# Patient Record
Sex: Male | Born: 1985 | Race: White | Hispanic: No | Marital: Married | Smoking: Never smoker
Health system: Southern US, Community
[De-identification: ages and names within clinical notes are randomized; demographics above are authoritative.]

## PROBLEM LIST (undated history)

## (undated) DIAGNOSIS — F419 Anxiety disorder, unspecified: Secondary | ICD-10-CM

## (undated) HISTORY — PX: WISDOM TOOTH EXTRACTION: SHX21

## (undated) HISTORY — DX: Anxiety disorder, unspecified: F41.9

---

## 2012-08-12 ENCOUNTER — Ambulatory Visit (INDEPENDENT_AMBULATORY_CARE_PROVIDER_SITE_OTHER): Payer: BC Managed Care – PPO | Admitting: Family Medicine

## 2012-08-12 VITALS — BP 112/72 | HR 99 | Temp 98.3°F | Resp 18 | Ht 72.0 in | Wt 210.0 lb

## 2012-08-12 DIAGNOSIS — K529 Noninfective gastroenteritis and colitis, unspecified: Secondary | ICD-10-CM

## 2012-08-12 DIAGNOSIS — R197 Diarrhea, unspecified: Secondary | ICD-10-CM

## 2012-08-12 DIAGNOSIS — R112 Nausea with vomiting, unspecified: Secondary | ICD-10-CM

## 2012-08-12 DIAGNOSIS — R509 Fever, unspecified: Secondary | ICD-10-CM

## 2012-08-12 DIAGNOSIS — R1011 Right upper quadrant pain: Secondary | ICD-10-CM

## 2012-08-12 DIAGNOSIS — F410 Panic disorder [episodic paroxysmal anxiety] without agoraphobia: Secondary | ICD-10-CM

## 2012-08-12 LAB — POCT URINALYSIS DIPSTICK
Protein, UA: 30
Spec Grav, UA: 1.025
Urobilinogen, UA: 0.2
pH, UA: 6

## 2012-08-12 LAB — POCT UA - MICROSCOPIC ONLY
Bacteria, U Microscopic: NEGATIVE
Casts, Ur, LPF, POC: NEGATIVE
Crystals, Ur, HPF, POC: NEGATIVE

## 2012-08-12 LAB — POCT CBC
HCT, POC: 49.6 % (ref 43.5–53.7)
Hemoglobin: 16.2 g/dL (ref 14.1–18.1)
Lymph, poc: 1.2 (ref 0.6–3.4)
MCH, POC: 29.8 pg (ref 27–31.2)
MCHC: 32.7 g/dL (ref 31.8–35.4)
MPV: 9.3 fL (ref 0–99.8)
POC Granulocyte: 7 — AB (ref 2–6.9)
POC LYMPH PERCENT: 13.5 %L (ref 10–50)
POC MID %: 7.5 %M (ref 0–12)
RDW, POC: 12.9 %
WBC: 8.8 10*3/uL (ref 4.6–10.2)

## 2012-08-12 LAB — POCT INFLUENZA A/B: Influenza A, POC: NEGATIVE

## 2012-08-12 LAB — IFOBT (OCCULT BLOOD): IFOBT: NEGATIVE

## 2012-08-12 MED ORDER — LOPERAMIDE HCL 2 MG PO TABS: 2.0000 mg | ORAL_TABLET | Freq: Four times a day (QID) | ORAL | Status: DC | PRN

## 2012-08-12 MED ORDER — ONDANSETRON 4 MG PO TBDP
8.0000 mg | ORAL_TABLET | Freq: Once | ORAL | Status: AC
  Administered 2012-08-12: 8 mg via ORAL

## 2012-08-12 MED ORDER — HYDROCODONE-ACETAMINOPHEN 5-325 MG PO TABS: 1.0000 | ORAL_TABLET | Freq: Four times a day (QID) | ORAL | Status: DC | PRN

## 2012-08-12 MED ORDER — PROMETHAZINE HCL 25 MG PO TABS: 25.0000 mg | ORAL_TABLET | Freq: Three times a day (TID) | ORAL | Status: DC | PRN

## 2012-08-12 MED ORDER — PROPRANOLOL HCL 40 MG PO TABS: 40.0000 mg | ORAL_TABLET | Freq: Four times a day (QID) | ORAL | Status: DC | PRN

## 2012-08-12 NOTE — Progress Notes (Addendum)
Subjective:    Patient ID: Nathan [name], male    DOB: Mar 19, 1986, 27 y.o.   MRN: [id number] Chief Complaint  Patient presents with  . Generalized Body Aches    today  . Fever    today  . Diarrhea    yesterday  . Nausea    yesterday   HPI  Sore throat for 2 wks and within the last 24 hrs he started getting much worse.  Vomiting but has been able to get 1 sip of water down and a little sprite. Temp was >100 this a.m. No recent antibiotics.  No recent sick contact, no travels, no recent alcohol. Has not been trying any otc medicines other than some chlorasceptic sprray.  Took a tylenol cold and flu.  Having diffuse abd pains - started epigastric last night (thought heartburn as hotdogs for lunch).  Then started throwing up.  Diarrhea large volume liquid - large.  Did get flu shot this yrs.  For several months, he has had some RUQ/Rt flank pains occ - has been wondering if he could have gallstones so recently has been trying to do a low fat diet.  Also for the past several months, when he is called on in work or school situations, he will get very nervous.  Happened to him first when he was called on his class - he developed sweating, palpitations, shortness of breath, thought he was going to pass out.  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  No prior h/o any mood d/o. No depression.  Didn't know what was going on when it first started but now realized it must be anxiety/nerves since it is so situationally reproducible.  Past Medical History  Diagnosis Date  . Anxiety    No current outpatient prescriptions on file prior to visit.   No current facility-administered medications on file prior to visit.   No Known Allergies   Review of Systems  Constitutional: Positive for fever, chills, diaphoresis, activity change, appetite change and fatigue. Negative for unexpected  weight change.  HENT: Positive for congestion, sore throat and rhinorrhea.   Respiratory: Negative for cough and shortness of breath.   Cardiovascular: Negative for chest pain.  Gastrointestinal: Positive for nausea, vomiting, abdominal pain, diarrhea and abdominal distention. Negative for constipation, blood in stool, anal bleeding and rectal pain.  Genitourinary: Negative for dysuria, frequency and decreased urine volume.  Skin: Negative for rash.  Hematological: Negative for adenopathy.  Psychiatric/Behavioral: Negative for dysphoric mood. The patient is nervous/anxious.       BP 112/72  Pulse 111  Temp(Src) 99.8 F (37.7 C) (Oral)  Resp 18  Ht 6' (1.829 m)  Wt 210 lb (95.255 kg)  BMI 28.47 kg/m2  SpO2 97% Objective:   Physical Exam  Constitutional: He appears well-developed and well-nourished. No distress.  HENT:  Head: Normocephalic and atraumatic.  Neck: Normal range of motion. Neck supple. No thyromegaly present.  Cardiovascular: Normal rate, regular rhythm and normal heart sounds.   Pulmonary/Chest: Effort normal and breath sounds normal.  Abdominal: Soft. Normal appearance. He exhibits no distension and no mass. Bowel sounds are increased. There is no hepatosplenomegaly. There is generalized tenderness. There is guarding. There is no rigidity, no rebound, no CVA tenderness, no tenderness at McBurney's point and negative Murphy's sign. No hernia.  Genitourinary: Rectum normal and prostate normal. Rectal exam shows no tenderness and anal tone  normal. Guaiac negative stool.  Lymphadenopathy:    He has no cervical adenopathy.  Skin: He is not diaphoretic.      Results for orders placed in visit on 08/12/12  COMPREHENSIVE METABOLIC PANEL      Result Value Range   Sodium 136  135 - 145 mEq/L   Potassium 4.2  3.5 - 5.3 mEq/L   Chloride 106  96 - 112 mEq/L   CO2 23  19 - 32 mEq/L   Glucose, Bld 99  70 - 99 mg/dL   BUN 13  6 - 23 mg/dL   Creat 5.40  9.81 - 1.91 mg/dL    Total Bilirubin 1.9 (*) 0.3 - 1.2 mg/dL   Alkaline Phosphatase 55  39 - 117 U/L   AST 20  0 - 37 U/L   ALT 27  0 - 53 U/L   Total Protein 6.7  6.0 - 8.3 g/dL   Albumin 4.6  3.5 - 5.2 g/dL   Calcium 9.1  8.4 - 47.8 mg/dL  FECAL LACTOFERRIN      Result Value Range   LACTOFERRIN POSITIVE    LIPASE      Result Value Range   Lipase 12  0 - 75 U/L  POCT CBC      Result Value Range   WBC 8.8  4.6 - 10.2 K/uL   Lymph, poc 1.2  0.6 - 3.4   POC LYMPH PERCENT 13.5  10 - 50 %L   MID (cbc) 0.7  0 - 0.9   POC MID % 7.5  0 - 12 %M   POC Granulocyte 7.0 (*) 2 - 6.9   Granulocyte percent 79.0  37 - 80 %G   RBC 5.43  4.69 - 6.13 M/uL   Hemoglobin 16.2  14.1 - 18.1 g/dL   HCT, POC 29.5  62.1 - 53.7 %   MCV 91.4  80 - 97 fL   MCH, POC 29.8  27 - 31.2 pg   MCHC 32.7  31.8 - 35.4 g/dL   RDW, POC 30.8     Platelet Count, POC 250  142 - 424 K/uL   MPV 9.3  0 - 99.8 fL  POCT INFLUENZA A/B      Result Value Range   Influenza A, POC Negative     Influenza B, POC Negative    POCT UA - MICROSCOPIC ONLY      Result Value Range   WBC, Ur, HPF, POC 0-1     RBC, urine, microscopic 0-1     Bacteria, U Microscopic neg     Mucus, UA trace     Epithelial cells, urine per micros 0-1     Crystals, Ur, HPF, POC neg     Casts, Ur, LPF, POC neg     Yeast, UA neg    POCT URINALYSIS DIPSTICK      Result Value Range   Color, UA amber     Clarity, UA clear     Glucose, UA neg     Bilirubin, UA small     Ketones, UA trace     Spec Grav, UA 1.025     Blood, UA neg     pH, UA 6.0     Protein, UA 30     Urobilinogen, UA 0.2     Nitrite, UA neg     Leukocytes, UA Negative    IFOBT (OCCULT BLOOD)      Result Value Range   IFOBT Negative  Assessment & Plan:  Diarrhea - Plan: POCT CBC, POCT UA - Microscopic Only, POCT urinalysis dipstick, Comprehensive metabolic panel, Fecal Lactoferrin, IFOBT POC (occult bld, rslt in office), ondansetron (ZOFRAN-ODT) disintegrating tablet 8 mg, Lipase  Fever -  Plan: POCT CBC, POCT Influenza A/B, POCT UA - Microscopic Only, POCT urinalysis dipstick, Comprehensive metabolic panel, Fecal Lactoferrin, IFOBT POC (occult bld, rslt in office), ondansetron (ZOFRAN-ODT) disintegrating tablet 8 mg, Lipase  Nausea with vomiting - Plan: POCT CBC, POCT UA - Microscopic Only, POCT urinalysis dipstick, Comprehensive metabolic panel, Fecal Lactoferrin, IFOBT POC (occult bld, rslt in office), ondansetron (ZOFRAN-ODT) disintegrating tablet 8 mg, Lipase  Gastroenteritis, acute - suspect viral. Treat symptomatically and push fluids  Panic attacks - Plan: propranolol (INDERAL) 40 MG tablet.  Sounds like pt is developing panic attacks in social anxiety producing situations.  Try prn propranolol and recommend therapy - given therapist referral for CBT with several names to contact.  Colicky RUQ abdominal pain - Plan: US Abdomen Limited RUQ - I do not think he is having acute cholecystitis at this time due to lack of peritoneal signs, acute abdomen, or murphy's sign  Meds ordered this encounter  Medications  . ondansetron (ZOFRAN-ODT) disintegrating tablet 8 mg    Sig:   . propranolol (INDERAL) 40 MG tablet    Sig: Take 1 tablet (40 mg total) by mouth 4 (four) times daily as needed (anxiety).    Dispense:  60 tablet    Refill:  2  . promethazine (PHENERGAN) 25 MG tablet    Sig: Take 1 tablet (25 mg total) by mouth every 8 (eight) hours as needed for nausea.    Dispense:  20 tablet    Refill:  0  . loperamide (IMODIUM A-D) 2 MG tablet    Sig: Take 1 tablet (2 mg total) by mouth 4 (four) times daily as needed for diarrhea or loose stools.    Dispense:  30 tablet    Refill:  0  . HYDROcodone-acetaminophen (NORCO/VICODIN) 5-325 MG per tablet    Sig: Take 1 tablet by mouth every 6 (six) hours as needed for pain.    Dispense:  10 tablet    Refill:  0

## 2012-08-12 NOTE — Patient Instructions (Addendum)
I STRONGLY recommend counseling or cognitive-behavioral therapy to learn about coping mechanisms to prevent/stop panic attacks.  Contact your HR department and ask about the EAP (employee assistance program).  At no cost to you and at complete confidentiality, they should arrange for you to see a counselor for several visits.   If this doesn't work, you may want to consider:  Garey Ham, at [facility] [address] [phone] or Wynelle Fanny, at [facility] [address] [phone] ? [phone] or Normajean Glasgow, at [facility] [address] [phone]  Jobe Gibbon at the [facility] [address] [phone] or   Viral Gastroenteritis Viral gastroenteritis is also known as stomach flu. This condition affects the stomach and intestinal tract. It can cause sudden diarrhea and vomiting. The illness typically lasts 3 to 8 days. Most people develop an immune response that eventually gets rid of the virus. While this natural response develops, the virus can make you quite ill. CAUSES  Many different viruses can cause gastroenteritis, such as rotavirus or noroviruses. You can catch one of these viruses by consuming contaminated food or water. You may also catch a virus by sharing utensils or other personal items with an infected person or by touching a contaminated surface. SYMPTOMS  The most common symptoms are diarrhea and vomiting. These problems can cause a severe loss of body fluids (dehydration) and a body salt (electrolyte) imbalance. Other symptoms may include:  Fever.  Headache.  Fatigue.  Abdominal pain. DIAGNOSIS  Your caregiver can usually diagnose viral gastroenteritis based on your symptoms and a physical exam. A stool sample may also be taken to test for the presence of viruses or other infections. TREATMENT  This illness typically goes away on  its own. Treatments are aimed at rehydration. The most serious cases of viral gastroenteritis involve vomiting so severely that you are not able to keep fluids down. In these cases, fluids must be given through an intravenous line (IV). HOME CARE INSTRUCTIONS   Drink enough fluids to keep your urine clear or pale yellow. Drink small amounts of fluids frequently and increase the amounts as tolerated.  Ask your caregiver for specific rehydration instructions.  Avoid:  Foods high in sugar.  Alcohol.  Carbonated drinks.  Tobacco.  Juice.  Caffeine drinks.  Extremely hot or cold fluids.  Fatty, greasy foods.  Too much intake of anything at one time.  Dairy products until 24 to 48 hours after diarrhea stops.  You may consume probiotics. Probiotics are active cultures of beneficial bacteria. They may lessen the amount and number of diarrheal stools in adults. Probiotics can be found in yogurt with active cultures and in supplements.  Wash your hands well to avoid spreading the virus.  Only take over-the-counter or prescription medicines for pain, discomfort, or fever as directed by your caregiver. Do not give aspirin to children. Antidiarrheal medicines are not recommended.  Ask your caregiver if you should continue to take your regular prescribed and over-the-counter medicines.  Keep all follow-up appointments as directed by your caregiver. SEEK IMMEDIATE MEDICAL CARE IF:   You are unable to keep fluids down.  You do not urinate at least once every 6 to 8 hours.  You develop shortness of breath.  You notice blood in your stool or vomit. This may look like coffee grounds.  You have abdominal pain that increases or is concentrated in one small area (localized).  You  have persistent vomiting or diarrhea.  You have a fever.  The patient is a child younger than 3 months, and he or she has a fever.  The patient is a child older than 3 months, and he or she has a fever and  persistent symptoms.  The patient is a child older than 3 months, and he or she has a fever and symptoms suddenly get worse.  The patient is a baby, and he or she has no tears when crying. MAKE SURE YOU:   Understand these instructions.  Will watch your condition.  Will get help right away if you are not doing well or get worse. Document Released: 05/15/2005 Document Revised: 08/07/2011 Document Reviewed: 03/01/2011 [facility] Patient Information 2013 [place], [place]. Cholecystitis Cholecystitis is an inflammation of your gallbladder. It is usually caused by a buildup of gallstones or sludge (cholelithiasis) in your gallbladder. The gallbladder stores a fluid that helps digest fats (bile). Cholecystitis is serious and needs treatment right away.  CAUSES   Gallstones. Gallstones can block the tube that leads to your gallbladder, causing bile to build up. As bile builds up, the gallbladder becomes inflamed.  Bile duct problems, such as blockage from scarring or kinking.  Tumors. Tumors can stop bile from leaving your gallbladder correctly, causing bile to build up. As bile builds up, the gallbladder becomes inflamed. SYMPTOMS   Nausea.  Vomiting.  Abdominal pain, especially in the upper right area of your abdomen.  Abdominal tenderness or bloating.  Sweating.  Chills.  Fever.  Yellowing of the skin and the whites of the eyes (jaundice). DIAGNOSIS  Your caregiver may order blood tests to look for infection or gallbladder problems. Your caregiver may also order imaging tests, such as an ultrasound or computed tomography (CT) scan. Further tests may include a hepatobiliary iminodiacetic acid (HIDA) scan. This scan allows your caregiver to see your bile move from the liver to the gallbladder and to the small intestine. TREATMENT  A hospital stay is usually necessary to lessen the inflammation of your gallbladder. You may be required to not eat or drink (fast) for a certain amount  of time. You may be given medicine to treat pain or an antibiotic medicine to treat an infection. Surgery may be needed to remove your gallbladder (cholecystectomy) once the inflammation has gone down. Surgery may be needed right away if you develop complications such as death of gallbladder tissue (gangrene) or a tear (perforation) of the gallbladder.  HOME CARE INSTRUCTIONS  Home care will depend on your treatment. In general:  If you were given antibiotics, take them as directed. Finish them even if you start to feel better.  Only take over-the-counter or prescription medicines for pain, discomfort, or fever as directed by your caregiver.  Follow a low-fat diet until you see your caregiver again.  Keep all follow-up visits as directed by your caregiver. SEEK IMMEDIATE MEDICAL CARE IF:   Your pain is increasing and not controlled by medicines.  Your pain moves to another part of your abdomen or to your back.  You have a fever.  You have nausea and vomiting. MAKE SURE YOU:  Understand these instructions.  Will watch your condition.  Will get help right away if you are not doing well or get worse. Document Released: 05/15/2005 Document Revised: 08/07/2011 Document Reviewed: 03/31/2011 [facility] Patient Information 2013 [place], [place]. Cholelithiasis Cholelithiasis (also called gallstones) is a form of gallbladder disease where gallstones form in your gallbladder. The gallbladder is a non-essential organ that  stores bile made in the liver, which helps digest fats. Gallstones begin as small crystals and slowly grow into stones. Gallstone pain occurs when the gallbladder spasms, and a gallstone is blocking the duct. Pain can also occur when a stone passes out of the duct.  Women are more likely to develop gallstones than men. Other factors that increase the risk of gallbladder disease are:  Having multiple pregnancies. Physicians sometimes advise removing diseased gallbladders before  future pregnancies.  Obesity.  Diets heavy in fried foods and fat.  Increasing age (older than 9).  Prolonged use of medications containing male hormones.  Diabetes mellitus.  Rapid weight loss.  Family history of gallstones (heredity). SYMPTOMS  Feeling sick to your stomach (nauseous).  Abdominal pain.  Yellowing of the skin (jaundice).  Sudden pain. It may persist from several minutes to several hours.  Worsening pain with deep breathing or when jarred.  Fever.  Tenderness to the touch. In some cases, when gallstones do not move into the bile duct, people have no pain or symptoms. These are called "silent" gallstones. TREATMENT In severe cases, emergency surgery may be required. HOME CARE INSTRUCTIONS   Only take over-the-counter or prescription medicines for pain, discomfort, or fever as directed by your caregiver.  Follow a low-fat diet until seen again. Fat causes the gallbladder to contract, which can result in pain.  Follow up as instructed. Attacks are almost always recurrent and surgery is usually required for permanent treatment. SEEK IMMEDIATE MEDICAL CARE IF:   Your pain increases and is not controlled by medications.  You have an oral temperature above 102 F (38.9 C), not controlled by medication.  You develop nausea and vomiting. MAKE SURE YOU:   Understand these instructions.  Will watch your condition.  Will get help right away if you are not doing well or get worse. Document Released: 05/11/2005 Document Revised: 08/07/2011 Document Reviewed: 07/14/2010 [facility] Patient Information 2013 [place], [place].

## 2012-08-13 LAB — COMPREHENSIVE METABOLIC PANEL
Alkaline Phosphatase: 55 U/L (ref 39–117)
BUN: 13 mg/dL (ref 6–23)
CO2: 23 mEq/L (ref 19–32)
Creat: 0.81 mg/dL (ref 0.50–1.35)
Glucose, Bld: 99 mg/dL (ref 70–99)
Total Bilirubin: 1.9 mg/dL — ABNORMAL HIGH (ref 0.3–1.2)
Total Protein: 6.7 g/dL (ref 6.0–8.3)

## 2012-08-13 LAB — LIPASE: Lipase: 12 U/L (ref 0–75)

## 2012-08-13 LAB — FECAL LACTOFERRIN, QUANT: Lactoferrin: POSITIVE

## 2012-08-23 ENCOUNTER — Telehealth: Payer: Self-pay

## 2012-08-23 NOTE — Telephone Encounter (Signed)
Thanks

## 2012-08-23 NOTE — Telephone Encounter (Signed)
Ok. I will send them the other codes and tell them to disregard the ones inconsistent with age.

## 2012-08-23 NOTE — Telephone Encounter (Signed)
Dr. Clelia Croft, Nathan [name] is sending [place] a message saying that the diagnosis on his last OV is inconsistent with his age. Can you please change his dx so that I can resubmit to [facility]. Thanks (The first diagnosis is newborn fever)

## 2012-08-23 NOTE — Telephone Encounter (Signed)
Yes, my assistant accidentally put this in but had already associated the flu test so I couldn't remove it.  There are plenty of other diagnosis in the note to associate the tests with. To get it removed, I guess you would need to cancel tests that have already been resulted and then cancel the diagnosis and re-add the test??? Or something?

## 2012-09-20 ENCOUNTER — Encounter: Payer: Self-pay | Admitting: Family Medicine

## 2012-09-20 ENCOUNTER — Ambulatory Visit: Payer: BC Managed Care – PPO | Admitting: Family Medicine

## 2012-09-20 VITALS — BP 120/64 | HR 63 | Temp 99.1°F | Resp 16 | Ht 72.0 in | Wt 213.0 lb

## 2012-09-20 DIAGNOSIS — F411 Generalized anxiety disorder: Secondary | ICD-10-CM

## 2012-09-20 DIAGNOSIS — F909 Attention-deficit hyperactivity disorder, unspecified type: Secondary | ICD-10-CM

## 2012-09-20 DIAGNOSIS — K2990 Gastroduodenitis, unspecified, without bleeding: Secondary | ICD-10-CM

## 2012-09-20 DIAGNOSIS — K2970 Gastritis, unspecified, without bleeding: Secondary | ICD-10-CM

## 2012-09-20 DIAGNOSIS — K219 Gastro-esophageal reflux disease without esophagitis: Secondary | ICD-10-CM

## 2012-09-20 MED ORDER — RANITIDINE HCL 150 MG PO TABS: 150.0000 mg | ORAL_TABLET | Freq: Two times a day (BID) | ORAL | Status: DC | PRN

## 2012-09-20 MED ORDER — ESOMEPRAZOLE MAGNESIUM 40 MG PO CPDR: 40.0000 mg | DELAYED_RELEASE_CAPSULE | Freq: Every day | ORAL | Status: DC

## 2012-09-20 MED ORDER — BUPROPION HCL ER (XL) 150 MG PO TB24: 150.0000 mg | ORAL_TABLET | Freq: Every day | ORAL | Status: DC

## 2012-09-20 MED ORDER — AMPHETAMINE-DEXTROAMPHETAMINE 10 MG PO TABS: 10.0000 mg | ORAL_TABLET | Freq: Two times a day (BID) | ORAL | Status: DC | PRN

## 2012-09-20 NOTE — Progress Notes (Signed)
  Subjective:    Patient ID: Nathan [name], male    DOB: 09-May-1986, 27 y.o.   MRN: [id number] Chief Complaint  Patient presents with  . ADHD    UNABLE TO FOCUS   HPI  Is very busy at work, working as an Comptroller.  Still having occ RUQ pains after eating and a lot of gas, back and forth between constipation and diarrhea.  No changes in diet. Does notice more sxs after spicey or fried foods.  He has been using nexium daily - takes it after dinner - does help.  Has always been easily distracted - by noises - hard to focus. If someone is tapping or clicking - every little sound distracts him.  Has been that way for a long time - now he works in an office with tiny cubicles so surrounded by people typing and on foods - can't focus on work.  Has tried to help focus - a friend has ADHD - so he is self-medicating w/ caffeine - maybe helps him focus a little better.  Putting ear buds in - has music in his ear which has helped.  He was evaluated in the 5th grade.  Did get into trouble - labeled "behaviourally-emotionally" handicapped-disabled, and very impulsive.  Has had gotten reminded not to jump around on cases as has several incomplete. Anxiety is fed by all the commotion and he can't focus.  Struggled through school. Papers and homework and As but always gets Bc and Cs on exam as couldn't get solid thought and always trouble finishing on time.  Maternal Grandmother had some psych problems  Propranolol has helped physical symptoms of anxiety - taking bid.  Past Medical History  Diagnosis Date  . Anxiety    No current outpatient prescriptions on file prior to visit.   No current facility-administered medications on file prior to visit.   No Known Allergies   Review of Systems    BP 120/64  Pulse 63  Temp(Src) 99.1 F (37.3 C) (Oral)  Resp 16  Ht 6' (1.829 m)  Wt 213 lb (96.616 kg)  BMI 28.88 kg/m2  SpO2 99% Objective:   Physical Exam        Assessment & Plan:   Unspecified gastritis and gastroduodenitis without mention of hemorrhage  GERD (gastroesophageal reflux disease)  ADHD (attention deficit hyperactivity disorder)  Anxiety state, unspecified  Meds ordered this encounter  Medications  . FIBER PO    Sig: Take by mouth daily.  Marland Kitchen DISCONTD: esomeprazole (NEXIUM) 40 MG capsule    Sig: Take 1 capsule (40 mg total) by mouth daily.    Dispense:  30 capsule    Refill:  3  . DISCONTD: ranitidine (ZANTAC) 150 MG tablet    Sig: Take 1 tablet (150 mg total) by mouth 2 (two) times daily as needed for heartburn.    Dispense:  90 tablet    Refill:  0  . DISCONTD: buPROPion (WELLBUTRIN XL) 150 MG 24 hr tablet    Sig: Take 1 tablet (150 mg total) by mouth daily.    Dispense:  30 tablet    Refill:  1  . DISCONTD: amphetamine-dextroamphetamine (ADDERALL) 10 MG tablet    Sig: Take 1 tablet (10 mg total) by mouth 2 (two) times daily as needed.    Dispense:  30 tablet    Refill:  0

## 2012-09-25 ENCOUNTER — Telehealth: Payer: Self-pay

## 2012-09-25 ENCOUNTER — Other Ambulatory Visit: Payer: Self-pay | Admitting: Family Medicine

## 2012-09-25 MED ORDER — METHYLPHENIDATE HCL 5 MG PO TABS: 5.0000 mg | ORAL_TABLET | Freq: Every day | ORAL | Status: DC

## 2012-09-25 NOTE — Telephone Encounter (Signed)
Medication is making patient dizzy and he is sweating profusely    [phone]

## 2012-09-25 NOTE — Telephone Encounter (Signed)
No, he does not want to try 1/2 tab, he states the Rx was not effective for him either. I can advise him to wait a couple weeks before trying the Ritalin, if you would like, but he does not want the Adderall again.

## 2012-09-25 NOTE — Telephone Encounter (Signed)
Did he try 1/2 tab instead of a whole tab? Or even 1/4 of a tab? If still having sxs with 1/2 tab, then I am happy to try switching him to ritalin instead of adderrall.  Alternatively, he may have less side effects on a sustained release tablet but I would like to get the wellbutrin into his system before we try that.

## 2012-09-25 NOTE — Telephone Encounter (Signed)
Called patient , he states he thinks the Adderall has caused the sweating, advised him it may be the Wellbutrin, but he is sure it is the Adderall, advised him to d/c this medication. He agrees, do you want to prescribe an alternative? He agrees to come in if the dizziness continues after d/c the Adderall.

## 2012-09-25 NOTE — Telephone Encounter (Signed)
I printed out a ritalin rx for him to pick up to try.  Advised to bring in adderrall bottle so we can safely dispose of it for him when he picks up new rx.  Make sure he feels back to baseline and sx free before trying the ritalin.

## 2012-09-26 NOTE — Telephone Encounter (Signed)
Pt notified that rx is ready for pick up

## 2012-09-30 ENCOUNTER — Telehealth: Payer: Self-pay | Admitting: Physician Assistant

## 2012-09-30 MED ORDER — METHYLPHENIDATE HCL 5 MG PO TABS: 5.0000 mg | ORAL_TABLET | Freq: Every day | ORAL | Status: DC

## 2012-09-30 NOTE — Telephone Encounter (Signed)
Patient returned with Adderall bottle.  Unable to locate Ritalin prescription. I have reprinted this after verifying he has not already received this rx.

## 2012-10-18 ENCOUNTER — Encounter: Payer: Self-pay | Admitting: Family Medicine

## 2012-10-18 ENCOUNTER — Ambulatory Visit (INDEPENDENT_AMBULATORY_CARE_PROVIDER_SITE_OTHER): Payer: BC Managed Care – PPO | Admitting: Family Medicine

## 2012-10-18 VITALS — BP 140/74 | HR 80 | Temp 98.2°F | Resp 16 | Ht 73.5 in | Wt 215.4 lb

## 2012-10-18 DIAGNOSIS — K2970 Gastritis, unspecified, without bleeding: Secondary | ICD-10-CM

## 2012-10-18 DIAGNOSIS — F410 Panic disorder [episodic paroxysmal anxiety] without agoraphobia: Secondary | ICD-10-CM

## 2012-10-18 DIAGNOSIS — B009 Herpesviral infection, unspecified: Secondary | ICD-10-CM

## 2012-10-18 DIAGNOSIS — F909 Attention-deficit hyperactivity disorder, unspecified type: Secondary | ICD-10-CM

## 2012-10-18 MED ORDER — BUPROPION HCL ER (XL) 150 MG PO TB24: 150.0000 mg | ORAL_TABLET | Freq: Every day | ORAL | Status: DC

## 2012-10-18 MED ORDER — METHYLPHENIDATE HCL 5 MG PO TABS: 5.0000 mg | ORAL_TABLET | Freq: Two times a day (BID) | ORAL | Status: DC

## 2012-10-18 MED ORDER — PROPRANOLOL HCL 40 MG PO TABS: 40.0000 mg | ORAL_TABLET | Freq: Four times a day (QID) | ORAL | Status: DC | PRN

## 2012-10-18 MED ORDER — VALACYCLOVIR HCL 1 G PO TABS: 1000.0000 mg | ORAL_TABLET | Freq: Every day | ORAL | Status: DC

## 2012-10-18 MED ORDER — RANITIDINE HCL 150 MG PO TABS: 150.0000 mg | ORAL_TABLET | Freq: Two times a day (BID) | ORAL | Status: DC | PRN

## 2012-10-18 MED ORDER — ESOMEPRAZOLE MAGNESIUM 40 MG PO CPDR: 40.0000 mg | DELAYED_RELEASE_CAPSULE | Freq: Every day | ORAL | Status: DC

## 2012-10-18 NOTE — Patient Instructions (Addendum)
Start taking the valtrex once per day now and continue this through your honeymoon. Then you can stop but restart if you are ever having a stressful time or ill (like going through training.) At first sign of an outbreak, restart valtrex twice a day for 3 days. Attention Deficit Hyperactivity Disorder Attention deficit hyperactivity disorder (ADHD) is a problem with behavior issues based on the way the brain functions (neurobehavioral disorder). It is a common reason for behavior and academic problems in school. CAUSES  The cause of ADHD is unknown in most cases. It may run in families. It sometimes can be associated with learning disabilities and other behavioral problems. SYMPTOMS  There are 3 types of ADHD. The 3 types and some of the symptoms include:  Inattentive  Gets bored or distracted easily.  Loses or forgets things. Forgets to hand in homework.  Has trouble organizing or completing tasks.  Difficulty staying on task.  An inability to organize daily tasks and school work.  Leaving projects, chores, or homework unfinished.  Trouble paying attention or responding to details. Careless mistakes.  Difficulty following directions. Often seems like is not listening.  Dislikes activities that require sustained attention (like chores or homework).  Hyperactive-impulsive  Feels like it is impossible to sit still or stay in a seat. Fidgeting with hands and feet.  Trouble waiting turn.  Talking too much or out of turn. Interruptive.  Speaks or acts impulsively.  Aggressive, disruptive behavior.  Constantly busy or on the go, noisy.  Combined  Has symptoms of both of the above. Often children with ADHD feel discouraged about themselves and with school. They often perform well below their abilities in school. These symptoms can cause problems in home, school, and in relationships with peers. As children get older, the excess motor activities can calm down, but the problems  with paying attention and staying organized persist. Most children do not outgrow ADHD but with good treatment can learn to cope with the symptoms. DIAGNOSIS  When ADHD is suspected, the diagnosis should be made by professionals trained in ADHD.  Diagnosis will include:  Ruling out other reasons for the child's behavior.  The caregivers will check with the child's school and check their medical records.  They will talk to teachers and parents.  Behavior rating scales for the child will be filled out by those dealing with the child on a daily basis. A diagnosis is made only after all information has been considered. TREATMENT  Treatment usually includes behavioral treatment often along with medicines. It may include stimulant medicines. The stimulant medicines decrease impulsivity and hyperactivity and increase attention. Other medicines used include antidepressants and certain blood pressure medicines. Most experts agree that treatment for ADHD should address all aspects of the child's functioning. Treatment should not be limited to the use of medicines alone. Treatment should include structured classroom management. The parents must receive education to address rewarding good behavior, discipline, and limit-setting. Tutoring or behavioral therapy or both should be available for the child. If untreated, the disorder can have long-term serious effects into adolescence and adulthood. HOME CARE INSTRUCTIONS   Often with ADHD there is a lot of frustration among the family in dealing with the illness. There is often blame and anger that is not warranted. This is a life long illness. There is no way to prevent ADHD. In many cases, because the problem affects the family as a whole, the entire family may need help. A therapist can help the family find better  ways to handle the disruptive behaviors and promote change. If the child is young, most of the therapist's work is with the parents. Parents will  learn techniques for coping with and improving their child's behavior. Sometimes only the child with the ADHD needs counseling. Your caregivers can help you make these decisions.  Children with ADHD may need help in organizing. Some helpful tips include:  Keep routines the same every day from wake-up time to bedtime. Schedule everything. This includes homework and playtime. This should include outdoor and indoor recreation. Keep the schedule on the refrigerator or a bulletin board where it is frequently seen. Mark schedule changes as far in advance as possible.  Have a place for everything and keep everything in its place. This includes clothing, backpacks, and school supplies.  Encourage writing down assignments and bringing home needed books.  Offer your child a well-balanced diet. Breakfast is especially important for school performance. Children should avoid drinks with caffeine including:  Soft drinks.  Coffee.  Tea.  However, some older children (adolescents) may find these drinks helpful in improving their attention.  Children with ADHD need consistent rules that they can understand and follow. If rules are followed, give small rewards. Children with ADHD often receive, and expect, criticism. Look for good behavior and praise it. Set realistic goals. Give clear instructions. Look for activities that can foster success and self-esteem. Make time for pleasant activities with your child. Give lots of affection.  Parents are their children's greatest advocates. Learn as much as possible about ADHD. This helps you become a stronger and better advocate for your child. It also helps you educate your child's teachers and instructors if they feel inadequate in these areas. Parent support groups are often helpful. A national group with local chapters is called CHADD (Children and Adults with Attention Deficit Hyperactivity Disorder). PROGNOSIS  There is no cure for ADHD. Children with the  disorder seldom outgrow it. Many find adaptive ways to accommodate the ADHD as they mature. SEEK MEDICAL CARE IF:  Your child has repeated muscle twitches, cough or speech outbursts.  Your child has sleep problems.  Your child has a marked loss of appetite.  Your child develops depression.  Your child has new or worsening behavioral problems.  Your child develops dizziness.  Your child has a racing heart.  Your child has stomach pains.  Your child develops headaches. Document Released: 05/05/2002 Document Revised: 08/07/2011 Document Reviewed: 12/16/2007 [facility] Patient Information 2014 [place], [place].  Methylphenidate tablets What is this medicine? METHYLPHENIDATE (meth il FEN i date) is used to treat attention-deficit hyperactivity disorder (ADHD). It is also used to treat narcolepsy. This medicine may be used for other purposes; ask your health care provider or pharmacist if you have questions. What should I tell my health care provider before I take this medicine? They need to know if you have any of these conditions: -difficulty swallowing, problems with the esophagus, or a history of blockage of the stomach or intestines -family history of suicide -glaucoma -heart condition or recent history of a heart attack -high blood pressure -history of a drug or alcohol abuse problem -liver disease -mental illness, including anxiety, bipolar disorder, depression, mania or schizophrenia -motor tics, family history or diagnosis of Tourette's syndrome -overactive thyroid -seizures -taken an MAOI like Carbex, Eldepryl, Marplan, Nardil, or Parnate in last 14 days -an unusual or allergic reaction to methylphenidate, other medicines, foods, dyes, or preservatives -pregnant or trying to get pregnant -breast-feeding How should I use this  medicine? Take this medicine by mouth with a glass of water. Follow the directions on the prescription label. It is best to take this medicine 30 to  45 minutes before meals, unless your doctor tells you otherwise. Take your medicine at regular intervals. Usually the last dose of the day will be taken at least 4 to 6 hours before bedtime, so it will not interfere with sleep. Do not take your medicine more often than directed. A special MedGuide will be given to you by the pharmacist with each prescription and refill. Be sure to read this information carefully each time. Talk to your pediatrician regarding the use of this medicine in children. While this drug may be prescribed for children as young as 73 years of age for selected conditions, precautions do apply. Overdosage: If you think you have taken too much of this medicine contact a poison control center or emergency room at once. NOTE: This medicine is only for you. Do not share this medicine with others. What if I miss a dose? If you miss a dose, take it as soon as you can. If it is almost time for your next dose, take only that dose. Do not take double or extra doses. What may interact with this medicine? Do not take this medicine with any of the following medications: -atomoxetine -lithium -medicines called MAO Inhibitors like Nardil, Parnate, Marplan, Eldepryl -other stimulant medicines like amphetamine, dextroamphetamine, dexmethylphenidate, modafinil -procarbazine This medicine may also interact with the following medications: -medicines for blood pressure -caffeine -medicines to decrease appetite or cause weight loss -medicines for depression, anxiety, or psychotic disturbances -medicines for seizures -warfarin This list may not describe all possible interactions. Give your health care provider a list of all the medicines, herbs, non-prescription drugs, or dietary supplements you use. Also tell them if you smoke, drink alcohol, or use illegal drugs. Some items may interact with your medicine. What should I watch for while using this medicine? Visit your doctor or health care  professional for regular checks on your progress. This prescription requires that you follow special procedures with your doctor and pharmacy. You will need to have a new written prescription from your doctor or health care professional every time you need a refill. This medicine may affect your concentration, or hide signs of tiredness. Until you know how this drug affects you, do not drive, ride a bicycle, use machinery, or do anything that needs mental alertness. If you are having trouble sleeping, and this continues to be a regular and bothersome side effect, contact your health care provider to discuss your options. Tell your doctor or health care professional if this medicine loses its effects, or if you feel you need to take more than the prescribed amount. Do not change the dosage without talking to your doctor or health care professional. Do not suddenly stop your medicine. Decreased appetite is a common side effect when starting this medicine. Eating small, frequent meals or snacks can help. Talk to your doctor if you continue to have poor eating habits. Height and weight growth of a child taking this medicine will be monitored closely. If you are scheduled for any surgery, tell your healthcare provider that you are taking this medicine. You may need to stop taking this medicine before the procedure. What side effects may I notice from receiving this medicine? Side effects that you should report to your doctor or health care professional as soon as possible: -allergic reactions like skin rash, itching or hives, swelling of  the face, lips, or tongue -anxiety or severe nervousness -chest pain -fast, irregular heartbeat -fever, or hot, dry skin -high blood pressure -uncontrollable head, mouth, neck, arm, or leg movements -unusual bleeding or bruising Side effects that usually do not require medical attention (report to your doctor or health care professional if they continue or are  bothersome): -headache -stomach upset -weight loss This list may not describe all possible side effects. Call your doctor for medical advice about side effects. You may report side effects to FDA at 1-800-FDA-1088. Where should I keep my medicine? Keep out of the reach of children. This medicine can be abused. Keep your medicine in a safe place to protect it from theft. Do not share this medicine with anyone. Selling or giving away this medicine is dangerous and against the law. Store at room temperature between 15 and 30 degrees C (59 and 86 degrees F). Protect from light and moisture. Keep container tightly closed. Throw away any unused medicine after the expiration date. NOTE: This sheet is a summary. It may not cover all possible information. If you have questions about this medicine, talk to your doctor, pharmacist, or health care provider.  2013, [employer]vier/[employer]. (05/12/2009 6:36:10 PM)

## 2012-10-18 NOTE — Progress Notes (Signed)
Subjective:    Patient ID: Nathan [name], male    DOB: 1985/07/19, 27 y.o.   MRN: [id number] Chief Complaint  Patient presents with  . Follow-up    medication    HPI  Nathan [name] had been doing much better since I last talked to him.  He tried the Adderrall but had some profuse sweating on it - huge sweat rings under his axilla - and made his stomach upset.  Has not taken the medicine for a week but has been gone at training for a wk - in [employer] and he forgot ALL of his medication at home - even the wellbutrin.  At the begining of the week, after first stopping all meds, did have a little w/d - felt unnatually confused - checked w/ pharmacist sister who told him this was expected.   But prior - did feel like it had been working.  The combo of the ritalin and propranolol has helped. Has tried 1 ritalin but had to increase to bid - 1 with breakfast and 1 with lunch as seem to only work for a couple hours -  he could definitely tell when it wore off - but it noticed that if he takes 1 in the morning and 1 at lunch along with propranolol, he can get through his work day.  At first he was having trouble sleeping but seems to have adjusted to that.  Ritalin has warn off by the time he gets home. No current problems with his stomach  Today is Nathan [name]'s first visit w/o [name] - his fiance. He is getting married in 3 wks so has been under a lot of stress considering he was away at wk of [employer] ([employer]?). He is looking forward to their honeymoon next mo.  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 He has only had 2-3 outbreaks - mainly when he gets really stressed or sick but she is on suppression dose valtrex and he was wondering if this would help.  Past Medical History  Diagnosis Date  . Anxiety    Current Outpatient Prescriptions on File Prior to Visit  Medication Sig Dispense Refill  . FIBER PO Take by mouth daily.       No current facility-administered medications on file prior to visit.   No Known  Allergies   Review of Systems  Constitutional: Positive for fatigue. Negative for diaphoresis, activity change, appetite change and unexpected weight change.  Respiratory: Negative for chest tightness and shortness of breath.   Cardiovascular: Negative for chest pain and palpitations.  Gastrointestinal: Negative for nausea, vomiting and abdominal pain.  Neurological: Negative for dizziness, tremors, syncope and light-headedness.  Psychiatric/Behavioral: Positive for decreased concentration. Negative for suicidal ideas, hallucinations, confusion, sleep disturbance, self-injury, dysphoric mood and agitation. The patient is nervous/anxious. The patient is not hyperactive.       BP 140/74  Pulse 80  Temp(Src) 98.2 F (36.8 C) (Oral)  Resp 16  Ht 6' 1.5" (1.867 m)  Wt 215 lb 6.4 oz (97.705 kg)  BMI 28.03 kg/m2  SpO2 97% Objective:   Physical Exam  Constitutional: He is oriented to person, place, and time. He appears well-developed and well-nourished. No distress.  HENT:  Head: Normocephalic and atraumatic.  Eyes: Conjunctivae are normal. Pupils are equal, round, and reactive to light. No scleral icterus.  Neck: Normal range of motion. Neck supple. No thyromegaly present.  Cardiovascular: Normal rate, regular rhythm, normal heart sounds and intact distal pulses.   Pulmonary/Chest: Effort normal and breath  sounds normal. No respiratory distress.  Musculoskeletal: He exhibits no edema.  Lymphadenopathy:    He has no cervical adenopathy.  Neurological: He is alert and oriented to person, place, and time.  Skin: Skin is warm and dry. He is not diaphoretic.  Psychiatric: He has a normal mood and affect. His behavior is normal.      Assessment & Plan:  Panic attacks - Plan: propranolol (INDERAL) 40 MG tablet  Herpes simplex - Agree w/ starting suppression dose valtrex now as anticipating stressful wks as the wedding approaches and really doesn't want an outbreak on their honeymoon. Then  can stop and use as needed for treatment (1 tab bid x 3d) or restart during stressful/ill times.  Adult ADHD- cont wellbutrin, propranolol, and bid ritalin 5mg  - 3 rxs given so f/u by 8/23 for additional refills.  Unspecified gastritis and gastroduodenitis without mention of hemorrhage  Meds ordered this encounter  Medications  . methylphenidate (RITALIN) 5 MG tablet    Sig: Take 1 tablet (5 mg total) by mouth 2 (two) times daily.    Dispense:  60 tablet    Refill:  0    Order Specific Question:  Supervising Provider    Answer:  DOOLITTLE, ROBERT P [[id number]]  . buPROPion (WELLBUTRIN XL) 150 MG 24 hr tablet    Sig: Take 1 tablet (150 mg total) by mouth daily.    Dispense:  90 tablet    Refill:  1  . esomeprazole (NEXIUM) 40 MG capsule    Sig: Take 1 capsule (40 mg total) by mouth daily.    Dispense:  30 capsule    Refill:  3  . propranolol (INDERAL) 40 MG tablet    Sig: Take 1 tablet (40 mg total) by mouth 4 (four) times daily as needed (anxiety).    Dispense:  60 tablet    Refill:  2  . ranitidine (ZANTAC) 150 MG tablet    Sig: Take 1 tablet (150 mg total) by mouth 2 (two) times daily as needed for heartburn.    Dispense:  90 tablet    Refill:  0  . valACYclovir (VALTREX) 1000 MG tablet    Sig: Take 1 tablet (1,000 mg total) by mouth daily.    Dispense:  30 tablet    Refill:  11  . methylphenidate (RITALIN) 5 MG tablet    Sig: Take 1 tablet (5 mg total) by mouth 2 (two) times daily. May fill on or after 12/16/12    Dispense:  60 tablet    Refill:  0  . methylphenidate (RITALIN) 5 MG tablet    Sig: Take 1 tablet (5 mg total) by mouth 2 (two) times daily. May fill on or after 11/16/12    Dispense:  60 tablet    Refill:  0

## 2013-01-17 ENCOUNTER — Ambulatory Visit (INDEPENDENT_AMBULATORY_CARE_PROVIDER_SITE_OTHER): Payer: BC Managed Care – PPO | Admitting: Family Medicine

## 2013-01-17 ENCOUNTER — Encounter: Payer: Self-pay | Admitting: Family Medicine

## 2013-01-17 VITALS — BP 124/72 | HR 68 | Temp 99.0°F | Resp 16 | Ht 72.0 in | Wt 218.4 lb

## 2013-01-17 DIAGNOSIS — H6983 Other specified disorders of Eustachian tube, bilateral: Secondary | ICD-10-CM

## 2013-01-17 DIAGNOSIS — H6980 Other specified disorders of Eustachian tube, unspecified ear: Secondary | ICD-10-CM

## 2013-01-17 DIAGNOSIS — H9313 Tinnitus, bilateral: Secondary | ICD-10-CM

## 2013-01-17 DIAGNOSIS — F988 Other specified behavioral and emotional disorders with onset usually occurring in childhood and adolescence: Secondary | ICD-10-CM

## 2013-01-17 DIAGNOSIS — H9319 Tinnitus, unspecified ear: Secondary | ICD-10-CM

## 2013-01-17 MED ORDER — FLUTICASONE PROPIONATE 50 MCG/ACT NA SUSP: 2.0000 | Freq: Every day | NASAL | Status: DC

## 2013-01-17 MED ORDER — METHYLPHENIDATE HCL 10 MG PO TABS: 10.0000 mg | ORAL_TABLET | Freq: Two times a day (BID) | ORAL | Status: DC

## 2013-01-17 MED ORDER — METHYLPHENIDATE HCL 5 MG PO TABS: 5.0000 mg | ORAL_TABLET | Freq: Two times a day (BID) | ORAL | Status: DC

## 2013-01-17 NOTE — Patient Instructions (Addendum)
Hot showers or breathing in steam may help loosen the congestion.  Using a netti pot or sinus rinse is also likely to help you feel better and keep this from progressing.  Use the fluticasone nasal spray every night before bed for at least 2 weeks.  I recommend augmenting with 12 hr sudafed (behind the counter) to help you move out the congestion.  If no improvement or you are getting worse, call as you might need a course of steroids but hopefully with all of the above, you can avoid it.

## 2013-03-21 ENCOUNTER — Ambulatory Visit (INDEPENDENT_AMBULATORY_CARE_PROVIDER_SITE_OTHER): Payer: BC Managed Care – PPO | Admitting: Family Medicine

## 2013-03-21 ENCOUNTER — Encounter: Payer: Self-pay | Admitting: Family Medicine

## 2013-03-21 VITALS — BP 120/70 | HR 72 | Temp 99.1°F | Resp 16 | Ht 72.0 in | Wt 206.8 lb

## 2013-03-21 DIAGNOSIS — M25569 Pain in unspecified knee: Secondary | ICD-10-CM

## 2013-03-21 DIAGNOSIS — F988 Other specified behavioral and emotional disorders with onset usually occurring in childhood and adolescence: Secondary | ICD-10-CM | POA: Insufficient documentation

## 2013-03-21 DIAGNOSIS — M25562 Pain in left knee: Secondary | ICD-10-CM

## 2013-03-21 NOTE — Patient Instructions (Addendum)
I recommend no more running currently. Hopefully we can get you back there again but lets get you through a complete course of PT first.  If at any time your knee pain is getting worse (or doesn't seem to be better after PT) come back to see me and we will obtain imaging and go from there.  Whenever you are do for a refill on the ritalin, just call our office or email me through MyChart.

## 2013-03-21 NOTE — Progress Notes (Signed)
Subjective:    Patient ID: Nathan [name], male    DOB: [birth year], 27 y.o.   MRN: [id number] Chief Complaint  Patient presents with  . Pain left knee x 3 months   HPI  Has to run 2 mi for his fitness test with the [employer].  When he has run prior - whole knee feels weak - like an intense pain in knee with extending and flexing knee. After running can hardly bend it at all due to pain - actually has to lift leg up with his arms to move knee. Will give way at times - used to be rare but now seems to be giving out/way about once a day - esp when he does stairs.  He had arthroscopic surg 6-7 yrs prev with a piece of torn meniscus removed at that time but tried to rehab it on his own after - has never gone through a full course of formal PT.   There is an alternative to the 2 mi fitness run - a walk - but needs to be under doctor's care with medical documentation to back this up.  Wife, [name], is doing well.  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  Past Medical History  Diagnosis Date  . Anxiety    Current Outpatient Prescriptions on File Prior to Visit  Medication Sig Dispense Refill  . esomeprazole (NEXIUM) 40 MG capsule Take 1 capsule (40 mg total) by mouth daily.  30 capsule  3  . FIBER PO Take by mouth daily.      . methylphenidate (RITALIN) 10 MG tablet Take 1 tablet (10 mg total) by mouth 2 (two) times daily.  60 tablet  0  . methylphenidate (RITALIN) 5 MG tablet Take 1 tablet (5 mg total) by mouth 2 (two) times daily. May fill on or after 03/18/13  60 tablet  0  . methylphenidate (RITALIN) 5 MG tablet Take 1 tablet (5 mg total) by mouth 2 (two) times daily. May fill on or after 02/16/13  60 tablet  0  . valACYclovir (VALTREX) 1000 MG tablet Take 1 tablet (1,000 mg total) by mouth daily.  30 tablet  11   No current facility-administered medications on  file prior to visit.   No Known Allergies  Review of Systems  Constitutional: Positive for activity change. Negative for fever, chills, diaphoresis and appetite change.  Musculoskeletal: Positive for arthralgias and gait problem. Negative for joint swelling and myalgias.  Skin: Negative for color change, rash and wound.  Neurological: Positive for weakness. Negative for numbness.  Hematological: Negative for adenopathy. Does not bruise/bleed easily.  Psychiatric/Behavioral: Positive for decreased concentration. Negative for behavioral problems, confusion, sleep disturbance, dysphoric mood and agitation. The patient is nervous/anxious. The patient is not hyperactive.       BP 120/70  Pulse 72  Temp(Src) 99.1 F (37.3 C) (Oral)  Resp 16  Ht 6' (1.829 m)  Wt 206 lb 12.8 oz (93.804 kg)  BMI 28.04 kg/m2  SpO2 98% Objective:   Physical Exam  Constitutional: He is oriented to person, place, and time. He appears well-developed and well-nourished. No distress.  HENT:  Head: Normocephalic and atraumatic.  Eyes: No scleral icterus.  Pulmonary/Chest: Effort normal.  Musculoskeletal:       Right knee: Normal.       Left knee: He  exhibits abnormal meniscus. He exhibits normal range of motion, no swelling, no effusion, no ecchymosis, no deformity, no erythema, normal alignment, no LCL laxity, normal patellar mobility, no bony tenderness and no MCL laxity. Tenderness found. Lateral joint line and LCL tenderness noted. No medial joint line, no MCL and no patellar tendon tenderness noted.  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  Skin: Skin is warm and dry. He is not diaphoretic.  Psychiatric: He has a normal mood and affect. His behavior is normal.       Assessment & Plan:  Knee meniscus pain, left - Plan: Ambulatory referral to Physical Therapy - I do not  think it is a good idea for pt to run long distances on his knee - note given. Rec PT for quad strengthening. If pain worsens, RTC to cons imaging.  ADD (attention deficit disorder) - just filled his ritalin and has at least 1-2 other rxs at home - [facility] reviewed and pt has no other controlled sub from any other doctors and has not been filling his ritilan regularly so does have some prior rx I wrote him left over. Therefore, will just have pt call or email when he needs new rx for refills and will provide 3 rxs at that time.  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  No orders of the defined types were placed in this encounter.    Norberto Sorenson, MD MPH

## 2013-03-31 ENCOUNTER — Emergency Department (HOSPITAL_BASED_OUTPATIENT_CLINIC_OR_DEPARTMENT_OTHER)
Admission: EM | Admit: 2013-03-31 | Discharge: 2013-03-31 | Disposition: A | Discharge status: Home / Self Care | Payer: BC Managed Care – PPO | Attending: Emergency Medicine | Admitting: Emergency Medicine

## 2013-03-31 ENCOUNTER — Encounter (HOSPITAL_BASED_OUTPATIENT_CLINIC_OR_DEPARTMENT_OTHER): Payer: Self-pay | Admitting: Emergency Medicine

## 2013-03-31 DIAGNOSIS — M79609 Pain in unspecified limb: Secondary | ICD-10-CM | POA: Insufficient documentation

## 2013-03-31 DIAGNOSIS — R197 Diarrhea, unspecified: Secondary | ICD-10-CM | POA: Insufficient documentation

## 2013-03-31 DIAGNOSIS — R112 Nausea with vomiting, unspecified: Secondary | ICD-10-CM

## 2013-03-31 DIAGNOSIS — Z79899 Other long term (current) drug therapy: Secondary | ICD-10-CM | POA: Insufficient documentation

## 2013-03-31 DIAGNOSIS — R109 Unspecified abdominal pain: Secondary | ICD-10-CM | POA: Insufficient documentation

## 2013-03-31 DIAGNOSIS — Z8659 Personal history of other mental and behavioral disorders: Secondary | ICD-10-CM | POA: Insufficient documentation

## 2013-03-31 LAB — CBC WITH DIFFERENTIAL/PLATELET
Basophils Absolute: 0 10*3/uL (ref 0.0–0.1)
Basophils Relative: 0 % (ref 0–1)
Eosinophils Relative: 0 % (ref 0–5)
Hemoglobin: 16.7 g/dL (ref 13.0–17.0)
Lymphocytes Relative: 5 % — ABNORMAL LOW (ref 12–46)
Lymphs Abs: 0.7 10*3/uL (ref 0.7–4.0)
MCHC: 36.4 g/dL — ABNORMAL HIGH (ref 30.0–36.0)
Monocytes Absolute: 1.4 10*3/uL — ABNORMAL HIGH (ref 0.1–1.0)
Neutro Abs: 11.9 10*3/uL — ABNORMAL HIGH (ref 1.7–7.7)
Neutrophils Relative %: 84 % — ABNORMAL HIGH (ref 43–77)
Platelets: 215 10*3/uL (ref 150–400)
RBC: 5.35 MIL/uL (ref 4.22–5.81)
RDW: 11.8 % (ref 11.5–15.5)
WBC: 14.1 10*3/uL — ABNORMAL HIGH (ref 4.0–10.5)

## 2013-03-31 LAB — BASIC METABOLIC PANEL
CO2: 26 mEq/L (ref 19–32)
Calcium: 9.9 mg/dL (ref 8.4–10.5)
Chloride: 103 mEq/L (ref 96–112)
Creatinine, Ser: 0.9 mg/dL (ref 0.50–1.35)
GFR calc Af Amer: >90 mL/min (ref >90)
Potassium: 3.5 mEq/L (ref 3.5–5.1)
Sodium: 140 mEq/L (ref 135–145)

## 2013-03-31 MED ORDER — METOCLOPRAMIDE HCL 5 MG/ML IJ SOLN
10.0000 mg | Freq: Once | INTRAMUSCULAR | Status: AC
  Administered 2013-03-31: 10 mg via INTRAVENOUS
  Filled 2013-03-31: qty 2

## 2013-03-31 MED ORDER — SODIUM CHLORIDE 0.9 % IV SOLN
1000.0000 mL | Freq: Once | INTRAVENOUS | Status: AC
  Administered 2013-03-31: 1000 mL via INTRAVENOUS

## 2013-03-31 MED ORDER — SODIUM CHLORIDE 0.9 % IV SOLN
1000.0000 mL | INTRAVENOUS | Status: DC
  Administered 2013-03-31 (×2): 1000 mL via INTRAVENOUS

## 2013-03-31 MED ORDER — SODIUM CHLORIDE 0.9 % IV BOLUS (SEPSIS)
1000.0000 mL | Freq: Once | INTRAVENOUS | Status: AC
  Administered 2013-03-31: 1000 mL via INTRAVENOUS

## 2013-03-31 MED ORDER — HYDROMORPHONE HCL PF 1 MG/ML IJ SOLN
1.0000 mg | Freq: Once | INTRAMUSCULAR | Status: AC
  Administered 2013-03-31: 1 mg via INTRAVENOUS
  Filled 2013-03-31: qty 1

## 2013-03-31 MED ORDER — LOPERAMIDE HCL 2 MG PO CAPS
4.0000 mg | ORAL_CAPSULE | Freq: Once | ORAL | Status: AC
  Administered 2013-03-31: 4 mg via ORAL
  Filled 2013-03-31: qty 2

## 2013-03-31 MED ORDER — ONDANSETRON HCL 4 MG/2ML IJ SOLN
4.0000 mg | Freq: Once | INTRAMUSCULAR | Status: AC
  Administered 2013-03-31: 4 mg via INTRAVENOUS
  Filled 2013-03-31: qty 2

## 2013-03-31 MED ORDER — DIPHENHYDRAMINE HCL 50 MG/ML IJ SOLN
25.0000 mg | Freq: Once | INTRAMUSCULAR | Status: AC
  Administered 2013-03-31: 25 mg via INTRAVENOUS
  Filled 2013-03-31: qty 1

## 2013-03-31 MED ORDER — PROMETHAZINE HCL 25 MG/ML IJ SOLN
25.0000 mg | Freq: Once | INTRAMUSCULAR | Status: AC
  Administered 2013-03-31: 25 mg via INTRAMUSCULAR
  Filled 2013-03-31: qty 1

## 2013-03-31 MED ORDER — METOCLOPRAMIDE HCL 10 MG PO TABS: 10.0000 mg | ORAL_TABLET | Freq: Four times a day (QID) | ORAL | Status: DC | PRN

## 2013-03-31 NOTE — ED Notes (Signed)
Dr. Preston Fleeting in speaking with Pt. About his symptoms.  Pt. Is able to answer all questions.

## 2013-03-31 NOTE — ED Provider Notes (Signed)
CSN: [id number]     Arrival date & time 03/31/13  0225 History   First MD Initiated Contact with Patient 03/31/13 0228     Chief Complaint  Patient presents with  . Emesis   (Consider location/radiation/quality/duration/timing/severity/associated sxs/prior Treatment) Patient is a 27 y.o. male presenting with vomiting. The history is provided by the patient.  Emesis He had his wisdom teeth extracted in about 4:30 PM. This evening, he developed crampy abdominal pain, nausea, vomiting, diarrhea associated with achiness in his legs. He denies fever chills. He had been given a dose of oxycodone following the extraction. He has not had any known sick contacts. He rates his pain at 7/10. Of note, he has not traveled to [place], but has been in contact with someone who did travel to [place]. Contact was about 2 weeks ago and his contact had been [place] about one month prior to that and is not sick. Therefore, he is not at risk for Ebola.  Past Medical History  Diagnosis Date  . Anxiety    Past Surgical History  Procedure Laterality Date  . Wisdom tooth extraction     Family History  Problem Relation Age of Onset  . Cancer Father    History  Substance Use Topics  . Smoking status: Never Smoker   . Smokeless tobacco: Not on file  . Alcohol Use: No    Review of Systems  Gastrointestinal: Positive for vomiting.  All other systems reviewed and are negative.    Allergies  Review of patient's allergies indicates no known allergies.  Home Medications   Current Outpatient Rx  Name  Route  Sig  Dispense  Refill  . esomeprazole (NEXIUM) 40 MG capsule   Oral   Take 1 capsule (40 mg total) by mouth daily.   30 capsule   3   . FIBER PO   Oral   Take by mouth daily.         . methylphenidate (RITALIN) 10 MG tablet   Oral   Take 1 tablet (10 mg total) by mouth 2 (two) times daily.   60 tablet   0   . methylphenidate (RITALIN) 5 MG tablet   Oral   Take 1 tablet (5  mg total) by mouth 2 (two) times daily. May fill on or after 03/18/13   60 tablet   0   . methylphenidate (RITALIN) 5 MG tablet   Oral   Take 1 tablet (5 mg total) by mouth 2 (two) times daily. May fill on or after 02/16/13   60 tablet   0   . valACYclovir (VALTREX) 1000 MG tablet   Oral   Take 1 tablet (1,000 mg total) by mouth daily.   30 tablet   11    BP 124/84  Pulse 94  Temp(Src) 97.5 F (36.4 C) (Oral)  Resp 16  Ht 6\' 1"  (1.854 m)  Wt 202 lb (91.627 kg)  BMI 26.66 kg/m2  SpO2 98% Physical Exam  Nursing note and vitals reviewed.  26 year old male, resting comfortably and in no acute distress. Vital signs are normal. Oxygen saturation is 98%, which is normal. Head is normocephalic and atraumatic. PERRLA, EOMI. Oropharynx is clear. Neck is nontender and supple without adenopathy or JVD. Back is nontender and there is no CVA tenderness. Lungs are clear without rales, wheezes, or rhonchi. Chest is nontender. Heart has regular rate and rhythm without murmur. Abdomen is soft, flat, nontender without masses or hepatosplenomegaly and peristalsis is  hypoactive. Extremities have no cyanosis or edema, full range of motion is present. Skin is pale, warm, and dry without rash. Neurologic: Mental status is normal, cranial nerves are intact, there are no motor or sensory deficits.  ED Course  Procedures (including critical care time) Labs Review Results for orders placed during the hospital encounter of 03/31/13  CBC WITH DIFFERENTIAL      Result Value Range   WBC 14.1 (*) 4.0 - 10.5 K/uL   RBC 5.35  4.22 - 5.81 MIL/uL   Hemoglobin 16.7  13.0 - 17.0 g/dL   HCT 47.8  29.5 - 62.1 %   MCV 85.8  78.0 - 100.0 fL   MCH 31.2  26.0 - 34.0 pg   MCHC 36.4 (*) 30.0 - 36.0 g/dL   RDW 30.8  65.7 - 84.6 %   Platelets 215  150 - 400 K/uL   Neutrophils Relative % 84 (*) 43 - 77 %   Neutro Abs 11.9 (*) 1.7 - 7.7 K/uL   Lymphocytes Relative 5 (*) 12 - 46 %   Lymphs Abs 0.7  0.7 - 4.0  K/uL   Monocytes Relative 10  3 - 12 %   Monocytes Absolute 1.4 (*) 0.1 - 1.0 K/uL   Eosinophils Relative 0  0 - 5 %   Eosinophils Absolute 0.1  0.0 - 0.7 K/uL   Basophils Relative 0  0 - 1 %   Basophils Absolute 0.0  0.0 - 0.1 K/uL  BASIC METABOLIC PANEL      Result Value Range   Sodium 140  135 - 145 mEq/L   Potassium 3.5  3.5 - 5.1 mEq/L   Chloride 103  96 - 112 mEq/L   CO2 26  19 - 32 mEq/L   Glucose, Bld 126 (*) 70 - 99 mg/dL   BUN 19  6 - 23 mg/dL   Creatinine, Ser 9.62  0.50 - 1.35 mg/dL   Calcium 9.9  8.4 - 95.2 mg/dL   GFR calc non Af Amer >90  >90 mL/min   GFR calc Af Amer >90  >90 mL/min   MDM   1. Nausea vomiting and diarrhea    Vomiting and diarrhea which may be related to his recent wisdom teeth extraction, could be viral gastroenteritis. As noted in history of present illness, do not feel he is at risk for Ebola based on the fact that it has been approximately 6 weeks since his contact had been in [place], his contact is not sick.  He was given IV fluids, IV hydromorphone, IV ondansetron and following this, he states that his pain is better but nausea has not improved. He also feels like he is going up or diarrhea. He is given oral loperamide and IV metoclopramide with improvement in nausea and diarrhea. He is discharged with prescription for metoclopramide and is advised to use over-the-counter loperamide as needed for diarrhea. He is given a note to be off work for the next 2 days.  Dione Booze, MD 03/31/13 [phone]

## 2013-03-31 NOTE — ED Notes (Addendum)
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 Spoke with Junious Dresser and she states no ebola  interventions were needed. Dr. Preston Fleeting made aware

## 2013-03-31 NOTE — ED Notes (Signed)
Pt actively vomiting while giving discharge instructions - Dr. Preston Fleeting notified.

## 2013-03-31 NOTE — ED Notes (Addendum)
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  Pt. Reports he started with vomiting and diarrhea.  Pt. Reports he has no Idea about a man he delt with in the past 2 wks who traveled to and from [place].  Pt reports this person who traveled to and from [place] has not been in [place] since the earlier part of the year.  Pt. Reports he had his wisdom teeth out today.  Pt. Has had 6 bouts of diarrhea and vomiting. Pt. Also reports he had back pain and body aches.

## 2013-07-02 NOTE — Progress Notes (Signed)
Subjective:    Patient ID: Nathan [name], male    DOB: [birth year], 28 y.o.   MRN: [id number] Chief Complaint  Patient presents with  . Follow-up    med recheck  . Tinnitus    both ears x 6 days    HPI  Nathan [name] is doing well overall - married life is great - had a great time on his honeymoon. Nathan [name] is doing well.  Work is find. No side effects from the adderall. He is currently preparing for the LSATs - has them scheduled in 2 wks so right now he is really cramming for those at night and over the weekend as well which is difficult but able to press ahead since he knows it is temporary and will be over soon.  Having a lot of trouble focusing at night to study after a full work day.  Ringing in his ears x 6d - no other sxs. No recent URI. Not using any otc meds, vitamins/supplements or other.  Past Medical History  Diagnosis Date  . Anxiety    Current Outpatient Prescriptions on File Prior to Visit  Medication Sig Dispense Refill  . esomeprazole (NEXIUM) 40 MG capsule Take 1 capsule (40 mg total) by mouth daily.  30 capsule  3  . FIBER PO Take by mouth daily.      . valACYclovir (VALTREX) 1000 MG tablet Take 1 tablet (1,000 mg total) by mouth daily.  30 tablet  11   No current facility-administered medications on file prior to visit.   No Known Allergies   Review of Systems  Constitutional: Negative for diaphoresis, activity change, appetite change, fatigue and unexpected weight change.  HENT: Positive for tinnitus. Negative for congestion, ear discharge, ear pain, hearing loss, postnasal drip, rhinorrhea, sinus pressure, sneezing, sore throat, trouble swallowing and voice change.   Respiratory: Negative for chest tightness and shortness of breath.   Cardiovascular: Negative for chest pain and palpitations.  Gastrointestinal: Negative for nausea and vomiting.  Neurological: Negative for dizziness, tremors, syncope and light-headedness.  Psychiatric/Behavioral: Positive for  decreased concentration. Negative for suicidal ideas, hallucinations, confusion, sleep disturbance, self-injury, dysphoric mood and agitation. The patient is nervous/anxious. The patient is not hyperactive.       BP 124/72  Pulse 68  Temp(Src) 99 F (37.2 C) (Oral)  Resp 16  Ht 6' (1.829 m)  Wt 218 lb 6.4 oz (99.066 kg)  BMI 29.61 kg/m2  SpO2 99% Objective:   Physical Exam  Constitutional: He is oriented to person, place, and time. He appears well-developed and well-nourished. No distress.  HENT:  Head: Normocephalic and atraumatic.  Right Ear: External ear and ear canal normal. Tympanic membrane is retracted. A middle ear effusion is present.  Left Ear: External ear and ear canal normal. Tympanic membrane is retracted. A middle ear effusion is present.  Nose: Nose normal.  Mouth/Throat: Oropharynx is clear and moist and mucous membranes are normal. No oropharyngeal exudate.  Eyes: Conjunctivae are normal. No scleral icterus.  Neck: Normal range of motion. Neck supple. No thyromegaly present.  Cardiovascular: Normal rate, regular rhythm, normal heart sounds and intact distal pulses.   Pulmonary/Chest: Effort normal and breath sounds normal. No respiratory distress.  Musculoskeletal: He exhibits no edema.  Lymphadenopathy:    He has no cervical adenopathy.  Neurological: He is alert and oriented to person, place, and time.  Skin: Skin is warm and dry. He is not diaphoretic. No erythema.  Psychiatric: He has a normal mood and affect. His  behavior is normal.      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  Eustachian tube dysfunction, bilateral - rec humidifier, freq nasal  saline, and start qhs flonase x 2 wks minimum.  Meds ordered this encounter  Medications  . fluticasone (FLONASE) 50 MCG/ACT nasal spray    Sig: Place 2 sprays into the nose at bedtime.    Dispense:  16 g    Refill:  1  . methylphenidate (RITALIN) 10 MG tablet    Sig: Take 1 tablet (10 mg total) by mouth 2 (two) times daily.    Dispense:  60 tablet    Refill:  0    Order Specific Question:  Supervising Provider    Answer:  DOOLITTLE, ROBERT P [[id number]]  . methylphenidate (RITALIN) 5 MG tablet    Sig: Take 1 tablet (5 mg total) by mouth 2 (two) times daily. May fill on or after 03/18/13    Dispense:  60 tablet    Refill:  0  . methylphenidate (RITALIN) 5 MG tablet    Sig: Take 1 tablet (5 mg total) by mouth 2 (two) times daily. May fill on or after 02/16/13    Dispense:  60 tablet    Refill:  0   Norberto Sorenson, MD MPH

## 2013-07-17 ENCOUNTER — Telehealth: Payer: Self-pay | Admitting: Family Medicine

## 2013-07-17 NOTE — Telephone Encounter (Signed)
Patient called requesting a refill on methylphenidate (RITALIN) 10 MG tablet  Please advise

## 2013-07-28 NOTE — Telephone Encounter (Signed)
Left message to return call 

## 2013-07-28 NOTE — Telephone Encounter (Signed)
Is pt taking 5mg  BID or 10mg  BID.  When I know the dose, I will write three months worth of prescriptions for pt, per Dr. Alver FisherShaw's last note.  He will need follow up when those prescriptions run out

## 2013-07-29 MED ORDER — METHYLPHENIDATE HCL 10 MG PO TABS: 10.0000 mg | ORAL_TABLET | Freq: Two times a day (BID) | ORAL | Status: DC

## 2013-07-29 NOTE — Telephone Encounter (Signed)
Rxs printed.  Meds ordered this encounter  Medications  . methylphenidate (RITALIN) 10 MG tablet    Sig: Take 1 tablet (10 mg total) by mouth 2 (two) times daily.    Dispense:  60 tablet    Refill:  0    Order Specific Question:  Supervising Provider    Answer:  DOOLITTLE, ROBERT P [[id number]]  . methylphenidate (RITALIN) 10 MG tablet    Sig: Take 1 tablet (10 mg total) by mouth 2 (two) times daily. May fill 30 days after date on prescription    Dispense:  60 tablet    Refill:  0    Order Specific Question:  Supervising Provider    Answer:  DOOLITTLE, ROBERT P [[id number]]  . methylphenidate (RITALIN) 10 MG tablet    Sig: Take 1 tablet (10 mg total) by mouth 2 (two) times daily. May fill 60 days after date on this prescription. Need office visit for additional refills.    Dispense:  60 tablet    Refill:  0    Order Specific Question:  Supervising Provider    Answer:  DOOLITTLE, ROBERT P [[id number]]

## 2013-07-29 NOTE — Telephone Encounter (Signed)
Notified pt on VM Rxs are ready for p/up.

## 2013-07-29 NOTE — Telephone Encounter (Signed)
LMOM to verify asking pt to return call to verify dosage.

## 2013-07-29 NOTE — Telephone Encounter (Signed)
Has recently been on both dosages. His last bottle was for 5mg  #60. He was taking two in the morning and two in the afternoon. He would like to have the 10mg .

## 2014-02-19 ENCOUNTER — Telehealth: Payer: Self-pay

## 2014-02-19 MED ORDER — METHYLPHENIDATE HCL 10 MG PO TABS: 10.0000 mg | ORAL_TABLET | Freq: Two times a day (BID) | ORAL | Status: DC

## 2014-02-19 NOTE — Telephone Encounter (Signed)
Refill printed and ready for pt pick up

## 2014-02-19 NOTE — Telephone Encounter (Signed)
Pt.notified

## 2014-02-19 NOTE — Telephone Encounter (Signed)
Pt made an appt on 03/13/2014 @ 2:45 with Dr. Clelia Croft.   Pt is out of methylphenidate (RITALIN) 10 MG tablet and needs to know if she can refill the medication, until his appt date.  PT # [phone]

## 2014-03-13 ENCOUNTER — Ambulatory Visit: Payer: BC Managed Care – PPO | Admitting: Family Medicine

## 2014-04-03 ENCOUNTER — Ambulatory Visit: Payer: BC Managed Care – PPO | Admitting: Family Medicine

## 2014-05-01 ENCOUNTER — Ambulatory Visit: Payer: BC Managed Care – PPO | Admitting: Family Medicine

## 2014-06-23 ENCOUNTER — Ambulatory Visit (INDEPENDENT_AMBULATORY_CARE_PROVIDER_SITE_OTHER): Payer: BLUE CROSS/BLUE SHIELD | Admitting: Family Medicine

## 2014-06-23 VITALS — BP 122/84 | HR 83 | Temp 98.3°F | Resp 18 | Ht 72.25 in | Wt 225.6 lb

## 2014-06-23 DIAGNOSIS — F909 Attention-deficit hyperactivity disorder, unspecified type: Secondary | ICD-10-CM

## 2014-06-23 DIAGNOSIS — F988 Other specified behavioral and emotional disorders with onset usually occurring in childhood and adolescence: Secondary | ICD-10-CM

## 2014-06-23 MED ORDER — METHYLPHENIDATE HCL 10 MG PO TABS: 10.0000 mg | ORAL_TABLET | Freq: Two times a day (BID) | ORAL | Status: DC

## 2014-06-29 NOTE — Progress Notes (Signed)
LMVM advising pt of follow up appt with Dr. Clelia CroftShaw 10/02/14 @ 9:00

## 2014-07-07 NOTE — Progress Notes (Signed)
Subjective:    Patient ID: Nathan [name], male    DOB: [birth year], 29 y.o.   MRN: [id number] Chief Complaint  Patient presents with  . Medication Refill  . Ritalin     HPI  Would like to restart prior dose of ritalin.  He went off of it after his LSATs last year and felt like he was doing ok but now has completed his first semester of [employer] and his grades were not as he was hoping (did not make the curve).  Nathan [name] has several scholarships and an internship that are depending upon good grades to cont receiving.  Pt's mentor was disappointed and recommended to Nathan [name] that he restart his meds.  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  Past Medical History  Diagnosis Date  . Anxiety    Current Outpatient Prescriptions on File Prior to Visit  Medication Sig Dispense Refill  . FIBER PO Take by mouth daily.    . metoCLOPramide (REGLAN) 10 MG tablet Take 1 tablet (10 mg total) by mouth every 6 (six) hours as needed (Nausea). 30 tablet 0  . valACYclovir (VALTREX) 1000 MG tablet Take 1 tablet (1,000 mg total) by mouth daily. 30 tablet 11   No current facility-administered medications on file prior to visit.   No Known Allergies   Review of Systems  Constitutional: Positive for activity change, fatigue and unexpected weight change. Negative for diaphoresis and appetite change.  Respiratory: Negative for chest tightness and shortness of breath.   Cardiovascular: Negative for chest pain and palpitations.  Gastrointestinal: Negative for nausea and vomiting.  Neurological: Negative for dizziness, tremors, syncope and light-headedness.  Psychiatric/Behavioral: Positive for decreased concentration. Negative for suicidal ideas, hallucinations, confusion, sleep disturbance,  self-injury, dysphoric mood and agitation. The patient is not nervous/anxious and is not hyperactive.        Objective:   Physical Exam  Constitutional: He is oriented to person, place, and time. He appears well-developed and well-nourished. No distress.  HENT:  Head: Normocephalic and atraumatic.  Eyes: Conjunctivae are normal. Pupils are equal, round, and reactive to light. No scleral icterus.  Neck: Normal range of motion. Neck supple. No thyromegaly present.  Cardiovascular: Normal rate, regular rhythm, normal heart sounds and intact distal pulses.   Pulmonary/Chest: Effort normal and breath sounds normal. No respiratory distress.  Musculoskeletal: He exhibits no edema.  Lymphadenopathy:    He has no cervical adenopathy.  Neurological: He is alert and oriented to person, place, and time.  Skin: Skin is warm and dry. He is not diaphoretic.  Psychiatric: He has a normal mood and affect. His behavior is normal.    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 Will consider weanning off again after law school and bar in 3-4 yrs.   Meds ordered this encounter  Medications  . methylphenidate (RITALIN)  10 MG tablet    Sig: Take 1 tablet (10 mg total) by mouth 2 (two) times daily. May fill 30 days after date on prescription    Dispense:  60 tablet    Refill:  0  . methylphenidate (RITALIN) 10 MG tablet    Sig: Take 1 tablet (10 mg total) by mouth 2 (two) times daily. May fill 60 days after date on this prescription.    Dispense:  60 tablet    Refill:  0  . methylphenidate (RITALIN) 10 MG tablet    Sig: Take 1 tablet (10 mg total) by mouth 2 (two) times daily.     Dispense:  60 tablet    Refill:  0  . methylphenidate (RITALIN) 10 MG tablet    Sig: Take 1 tablet (10 mg total) by mouth 2 (two) times daily. May fill 90 days after date of this prescription.    Dispense:  60 tablet    Refill:  0    I personally performed the services described in this documentation, which was scribed in my presence. The recorded information has been reviewed and considered, and addended by me as needed.  Norberto Sorenson, MD MPH

## 2014-10-02 ENCOUNTER — Ambulatory Visit (INDEPENDENT_AMBULATORY_CARE_PROVIDER_SITE_OTHER): Payer: BLUE CROSS/BLUE SHIELD | Admitting: Family Medicine

## 2014-10-02 ENCOUNTER — Encounter: Payer: Self-pay | Admitting: Family Medicine

## 2014-10-02 VITALS — BP 114/66 | HR 93 | Temp 97.9°F | Resp 16 | Ht 73.0 in | Wt 229.8 lb

## 2014-10-02 DIAGNOSIS — F988 Other specified behavioral and emotional disorders with onset usually occurring in childhood and adolescence: Secondary | ICD-10-CM

## 2014-10-02 DIAGNOSIS — F900 Attention-deficit hyperactivity disorder, predominantly inattentive type: Secondary | ICD-10-CM

## 2014-10-02 MED ORDER — METHYLPHENIDATE HCL 10 MG PO TABS: ORAL_TABLET | ORAL | Status: DC

## 2014-10-02 MED ORDER — METHYLPHENIDATE HCL ER (OSM) 27 MG PO TBCR: 27.0000 mg | EXTENDED_RELEASE_TABLET | ORAL | Status: DC

## 2014-10-02 NOTE — Progress Notes (Signed)
Subjective:   This chart was scribed for Dr. Norberto SorensonEva Danial Hlavac, MD by Jarvis Morganaylor Ferguson, ED Scribe. This patient was seen in Room 24 and the patient's care was started at 10:23 AM.   Patient ID: Nathan SniderAdam Tufo, male    DOB: December 06, 1985, 29 y.o.   MRN: [id number]  Chief Complaint  Patient presents with  . Follow-up    MEDICATION - RITALIN    HPI HPI Comments: Nathan [name] is a 29 y.o. male with a h/o anxiety and ADD who presents to the [facility] for a medication refill of his Ritalin prescription. He states improvement in his school performance since starting the medication. He is currently in law school. He states he is not always sleeping well but he takes Melatonin 1x per night with relief. He is currently taking the Ritalin 10 mg 2x daily every 4 hours. Pt thinks he may need to move up to higher dose. Pt believes he may have an issue with OCD which is exacerbated by his stress level. He denies any other issues at this time.   His wife is going to [employer] every other week for a transfusion of a new med to treat her Lupus - she works a ton as a Archivistteacher and preparing outside [employer] classroom - almost works harder than him but now severe lupus flair.  Past Medical History  Diagnosis Date  . Anxiety    Current Outpatient Prescriptions on File Prior to Visit  Medication Sig Dispense Refill  . FIBER PO Take by mouth daily.    . methylphenidate (RITALIN) 10 MG tablet Take 1 tablet (10 mg total) by mouth 2 (two) times daily. May fill 30 days after date on prescription 60 tablet 0  . methylphenidate (RITALIN) 10 MG tablet Take 1 tablet (10 mg total) by mouth 2 (two) times daily. May fill 60 days after date on this prescription. 60 tablet 0  . methylphenidate (RITALIN) 10 MG tablet Take 1 tablet (10 mg total) by mouth 2 (two) times daily. 60 tablet 0  . methylphenidate (RITALIN) 10 MG tablet Take 1 tablet (10 mg total) by mouth 2 (two) times daily. May fill 90 days after date of this  prescription. 60 tablet 0  . metoCLOPramide (REGLAN) 10 MG tablet Take 1 tablet (10 mg total) by mouth every 6 (six) hours as needed (Nausea). (Patient not taking: Reported on 10/02/2014) 30 tablet 0  . valACYclovir (VALTREX) 1000 MG tablet Take 1 tablet (1,000 mg total) by mouth daily. (Patient not taking: Reported on 10/02/2014) 30 tablet 11   No current facility-administered medications on file prior to visit.   Not on File   Review of Systems  Constitutional: Positive for activity change, fatigue and unexpected weight change. Negative for fever, chills, diaphoresis and appetite change.  Respiratory: Negative for apnea, chest tightness and shortness of breath.   Cardiovascular: Negative for chest pain, palpitations and leg swelling.  Gastrointestinal: Negative for nausea, vomiting and abdominal pain.  Allergic/Immunologic: Negative for immunocompromised state.  Neurological: Negative for tremors and syncope.  Psychiatric/Behavioral: Positive for sleep disturbance and decreased concentration. Negative for suicidal ideas, self-injury and dysphoric mood. The patient is nervous/anxious (at baseline, h/o).        Objective:   Physical Exam  Constitutional: He is oriented to person, place, and time. He appears well-developed and well-nourished. No distress.  HENT:  Head: Normocephalic and atraumatic.  Eyes: Conjunctivae and EOM are normal.  Neck: Neck supple. No tracheal deviation present. No thyroid  mass and no thyromegaly present.  Cardiovascular: Normal rate, regular rhythm, S1 normal, S2 normal and normal heart sounds.   No murmur heard. Pulmonary/Chest: Effort normal and breath sounds normal. No respiratory distress.  Musculoskeletal: Normal range of motion.  Neurological: He is alert and oriented to person, place, and time.  Skin: Skin is warm and dry.  Psychiatric: He has a normal mood and affect. His behavior is normal.  Nursing note and vitals reviewed.  BP 114/66 mmHg  Pulse  93  Temp(Src) 97.9 F (36.6 C) (Oral)  Resp 16  Ht 6\' 1"  (1.854 m)  Wt 229 lb 12.8 oz (104.237 kg)  BMI 30.33 kg/m2  SpO2 96%     Assessment & Plan:   Fasting labs at next visit 1. ADD (attention deficit disorder) without hyperactivity   Using ritalin 10mg  but has had to up to tid on some days - going to take summer classes in [employer] and externship so only 10d vacation - will take a drug holiday during this.   Using melatonin to help sleep nightly - low dose but working well. Try switching to long acting - trial of concerta - then can use prn IR Ritalin after lunch or late afternoon depending on work/study sched.   Meds ordered this encounter  Medications  . methylphenidate 27 MG PO CR tablet    Sig: Take 1 tablet (27 mg total) by mouth every morning.    Dispense:  30 tablet    Refill:  0    May fill 90 days after date written  . methylphenidate (RITALIN) 10 MG tablet    Sig: Take as instructed by physician. May fill 30 days after date on prescription    Dispense:  60 tablet    Refill:  0  . methylphenidate (RITALIN) 10 MG tablet    Sig: Take as instructed by physician. May fill 60 days after date on this prescription.    Dispense:  60 tablet    Refill:  0  . methylphenidate (RITALIN) 10 MG tablet    Sig: Take instructed by physician    Dispense:  60 tablet    Refill:  0  . methylphenidate (RITALIN) 10 MG tablet    Sig: Take instructed by physician. May fill 90 days after date of this prescription.    Dispense:  60 tablet    Refill:  0  . methylphenidate 27 MG PO CR tablet    Sig: Take 1 tablet (27 mg total) by mouth every morning.    Dispense:  30 tablet    Refill:  0    May fill 60 days after date written  . methylphenidate 27 MG PO CR tablet    Sig: Take 1 tablet (27 mg total) by mouth every morning.    Dispense:  30 tablet    Refill:  0    May fill 30 days after date written  . methylphenidate (CONCERTA) 27 MG PO CR tablet    Sig: Take 1 tablet (27 mg total)  by mouth every morning.    Dispense:  30 tablet    Refill:  0    I personally performed the services described in this documentation, which was scribed in my presence. The recorded information has been reviewed and considered, and addended by me as needed.  Norberto SorensonEva Sheryll Dymek, MD MPH

## 2014-10-02 NOTE — Patient Instructions (Signed)
Start the concerta every morning.  Augment with 1/2 tab  Make an appt Daiva EvesRobert Milan [phone] at [address] to discuss the ADD and OCD with behavioral/practical interventions and techniques so you can use the way your mind works to your advantage and function efficiently off of medication.  Obsessive-Compulsive Disorder Obsessive-compulsive disorder (OCD) is a mental illness. People with OCD have obsessions, compulsions, or both. Obsessions are unwanted and distressing thoughts, ideas, or urges that keep entering your mind. You may find yourself trying to ignore them. You may try to stop or undo them with a compulsion. Compulsions are repetitive physical or mental acts that you feel driven to perform. The actsmay seem senseless or excessive. They usually reduce or prevent any emotional distress. Obsessions and compulsions can be time consuming (take more than 1 hour a day). They can interfere with personal relationships and normal activities at home, school, or work.  OCD usually starts in young adulthood and continues throughout life. People who have family members with OCD are at higher risk for developing OCD. Women are at higher risk for OCD during and after pregnancy. Many people with OCD also have depression or another mental health disorder. SYMPTOMS  People with obsessions usually have a fear that something terrible will happen or that they will do something terrible. Examples of common obsessions include:   Fear of contamination with germs, bodily waste, or poisonous substances.  Fear of making the wrong decision (self-doubt).  Violent or sexual thoughts or urges towards others.  Need for symmetry or exactness. Examples of typical compulsive acts include:   Excessive hand washing or bathing due to fear of contamination.  Checking things over and over to make sure a task has been completed. For example, making sure a door is locked or a toaster is unplugged.  Repeating an  act or phrase over and over, sometimes a specific number of times, until it feels right.  Arranging and rearranging objects to keep them in a certain order. DIAGNOSIS  OCD is diagnosed through an assessment by your health care provider. Your health care provider will ask questions about any obsessions or compulsions you have and how they affect your life. Your health care provider may also ask about your medical history, prescription medicine, and drug use. Certain medical conditions and substances can cause symptoms similar to OCD. TREATMENT  The following forms of treatment are available for OCD:  Cognitive therapy. This is a form of talk therapy. The goal is to identify and change the irrational thoughts associated with obsessions.  Behavioral therapy. This is also a form of talk therapy. The goal is to target and reduce compulsive acts (behaviors). A specific type of behavioral therapy called exposure and response prevention is considered the most effective treatment for OCD. You will be exposed to the distressing situation that triggers your compulsion and prevented from responding to it. With repetition of this process over time, you will no longer feel the distress or need to perform the compulsion.   Medicine. Certain types of antidepressant medicine may help reduce or control OCD symptoms. Medication is most effective when it is used with cognitive or behavioral therapy. For severe OCD that does not respond to talk therapy and medication, brain surgery or electrical stimulation of specific areas of the brain (deep brain stimulation) may be helpful. HOME CARE INSTRUCTIONS   Take all medicines as prescribed.  Check with your health care provider before starting new prescription or over-the-counter medicines. SEEK MEDICAL CARE IF:  If you are not able to take your medicines as prescribed.  If your symptoms get worse. SEEK IMMEDIATE MEDICAL CARE IF:  You feel that any of your ideas or  actions are slipping out of your control. Document Released: 05/09/2001 Document Revised: 05/20/2013 Document Reviewed: 12/27/2012 [facility] Patient Information 2015 [employer] [place]. This information is not intended to replace advice given to you by your health care provider. Make sure you discuss any questions you have with your health care provider.

## 2015-01-28 ENCOUNTER — Ambulatory Visit (INDEPENDENT_AMBULATORY_CARE_PROVIDER_SITE_OTHER): Payer: BLUE CROSS/BLUE SHIELD | Admitting: Family Medicine

## 2015-01-28 ENCOUNTER — Encounter: Payer: Self-pay | Admitting: Family Medicine

## 2015-01-28 VITALS — BP 120/72 | HR 85 | Temp 98.2°F | Resp 16 | Ht 72.5 in | Wt 207.6 lb

## 2015-01-28 DIAGNOSIS — Z130 Encounter for screening for diseases of the blood and blood-forming organs and certain disorders involving the immune mechanism: Secondary | ICD-10-CM | POA: Diagnosis not present

## 2015-01-28 DIAGNOSIS — Z1389 Encounter for screening for other disorder: Secondary | ICD-10-CM | POA: Diagnosis not present

## 2015-01-28 DIAGNOSIS — Z1383 Encounter for screening for respiratory disorder NEC: Secondary | ICD-10-CM

## 2015-01-28 DIAGNOSIS — Z1329 Encounter for screening for other suspected endocrine disorder: Secondary | ICD-10-CM | POA: Diagnosis not present

## 2015-01-28 DIAGNOSIS — F988 Other specified behavioral and emotional disorders with onset usually occurring in childhood and adolescence: Secondary | ICD-10-CM

## 2015-01-28 DIAGNOSIS — F900 Attention-deficit hyperactivity disorder, predominantly inattentive type: Secondary | ICD-10-CM | POA: Diagnosis not present

## 2015-01-28 DIAGNOSIS — Z136 Encounter for screening for cardiovascular disorders: Secondary | ICD-10-CM

## 2015-01-28 MED ORDER — METHYLPHENIDATE HCL ER (OSM) 27 MG PO TBCR: 27.0000 mg | EXTENDED_RELEASE_TABLET | ORAL | Status: DC

## 2015-01-28 NOTE — Progress Notes (Signed)
Subjective:   This chart was scribed for Dr. Norberto Sorenson, MD by Jarvis Morgan, ED Scribe. This patient was seen in Room 24 and the patient's care was started at 10:23 AM.   Patient ID: Nathan [name], male    DOB: 03/23/1986, 29 y.o.   MRN: [id number]  Chief Complaint  Patient presents with  . Follow-up  . ADD  . Medication Refill    HPI  HPI Comments: Nathan [name] is a 29 y.o. male with a h/o anxiety and ADD who presents to the [facility] for a medication refill of his ADD stimulant medication. He states improvement in his school performance since switching to concerta from bid ritalin 10 at his last OV. He is currently in law school.  Now that he is on the concerta for a steadier effect he is no longer taking as many drug holidays - esp as he often has to get schoolwork done on wkends. He states he is sleeping well and rarely needs melatonin.   Failed adderall prior due to side effects.  Fortunately, concerta is now a preferred med for his insurance.   His wife's lupus flair is sig improvement - still getting infusions at [facility] every couple of wks but she has started teaching pre-K at [employer]  We have not done routine labs in some time so requested to come in today for fasting labs with refills but pt forgot - is not fasting.  Past Medical History  Diagnosis Date  . Anxiety    Past Surgical History  Procedure Laterality Date  . Wisdom tooth extraction     Current Outpatient Prescriptions on File Prior to Visit  Medication Sig Dispense Refill  . FIBER PO Take by mouth daily.    . methylphenidate 27 MG PO CR tablet Take 1 tablet (27 mg total) by mouth every morning. 30 tablet 0  . methylphenidate 27 MG PO CR tablet Take 1 tablet (27 mg total) by mouth every morning. 30 tablet 0  . methylphenidate 27 MG PO CR tablet Take 1 tablet (27 mg total) by mouth every morning. 30 tablet 0   No current facility-administered medications on file prior to visit.   No  Known Allergies Family History  Problem Relation Age of Onset  . Cancer Father    Social History   Social History  . Marital Status: Single    Spouse Name: N/A  . Number of Children: N/A  . Years of Education: N/A   Social History Main Topics  . Smoking status: Never Smoker   . Smokeless tobacco: None  . Alcohol Use: No  . Drug Use: None  . Sexual Activity: Yes   Other Topics Concern  . None   Social History Narrative     Review of Systems  Constitutional: Positive for activity change. Negative for fever, chills, diaphoresis, appetite change and fatigue.  Respiratory: Negative for apnea, chest tightness and shortness of breath.   Cardiovascular: Negative for chest pain, palpitations and leg swelling.  Gastrointestinal: Negative for nausea, vomiting and abdominal pain.  Allergic/Immunologic: Negative for immunocompromised state.  Neurological: Negative for tremors and syncope.  Psychiatric/Behavioral: Positive for decreased concentration. Negative for suicidal ideas, behavioral problems, sleep disturbance, self-injury, dysphoric mood and agitation. The patient is not nervous/anxious.        Objective:   Physical Exam  Constitutional: He is oriented to person, place, and time. He appears well-developed and well-nourished. No distress.  HENT:  Head: Normocephalic and atraumatic.  Eyes:  Conjunctivae and EOM are normal.  Neck: Neck supple. No tracheal deviation present. No thyroid mass and no thyromegaly present.  Cardiovascular: Normal rate, regular rhythm, S1 normal, S2 normal and normal heart sounds.   No murmur heard. Pulmonary/Chest: Effort normal and breath sounds normal. No respiratory distress.  Musculoskeletal: Normal range of motion.  Neurological: He is alert and oriented to person, place, and time.  Skin: Skin is warm and dry.  Psychiatric: He has a normal mood and affect. His behavior is normal.  Nursing note and vitals reviewed.   BP 120/72 mmHg  Pulse  85  Temp(Src) 98.2 F (36.8 C) (Oral)  Resp 16  Ht 6' 0.5" (1.842 m)  Wt 207 lb 9.6 oz (94.167 kg)  BMI 27.75 kg/m2  SpO2 96%  Assessment & Plan:   1. ADD (attention deficit disorder) without hyperactivity - doing much better on concerta and has not needed the midday adderall IR  10 so cont concerta alone  2. Screening for cardiovascular, respiratory, and genitourinary diseases   3. Screening for thyroid disorder   4. Screening for deficiency anemia   We do not have routine screening labs on pt but he was not fasting today so future orders entered for lab only fasting visit  Orders Placed This Encounter  Procedures  . CBC  . Lipid panel    Order Specific Question:  Has the patient fasted?    Answer:  Yes  . Comprehensive metabolic panel    Order Specific Question:  Has the patient fasted?    Answer:  Yes  . TSH    Meds ordered this encounter  Medications  . methylphenidate (CONCERTA) 27 MG PO CR tablet    Sig: Take 1 tablet (27 mg total) by mouth every morning.    Dispense:  30 tablet    Refill:  0    May fill 80 days from date written  . methylphenidate (CONCERTA) 27 MG PO CR tablet    Sig: Take 1 tablet (27 mg total) by mouth every morning.    Dispense:  30 tablet    Refill:  0    May fill 56 days from date written  . methylphenidate (CONCERTA) 27 MG PO CR tablet    Sig: Take 1 tablet (27 mg total) by mouth every morning.    Dispense:  30 tablet    Refill:  0    May fill 28 days from date written  . methylphenidate (CONCERTA) 27 MG PO CR tablet    Sig: Take 1 tablet (27 mg total) by mouth every morning.    Dispense:  30 tablet    Refill:  0    Norberto Sorenson, MD MPH

## 2015-01-29 ENCOUNTER — Ambulatory Visit: Payer: BLUE CROSS/BLUE SHIELD | Admitting: Family Medicine

## 2015-03-27 ENCOUNTER — Telehealth: Payer: Self-pay | Admitting: Family Medicine

## 2015-03-27 NOTE — Telephone Encounter (Signed)
lmom to call and reschedule his appt that he had with Clelia CroftShaw on 06/03/15

## 2015-06-01 ENCOUNTER — Telehealth: Payer: Self-pay

## 2015-06-01 NOTE — Telephone Encounter (Signed)
Pt in need of his CONCERTA 27 MG PO CR TABLETS. Please call [phone], Dr Clelia CroftShaw had written a paper with 4 on them but the pharmacy would only fill 3 at a time

## 2015-06-02 MED ORDER — METHYLPHENIDATE HCL ER (OSM) 27 MG PO TBCR: 27.0000 mg | EXTENDED_RELEASE_TABLET | ORAL | Status: DC

## 2015-06-02 NOTE — Telephone Encounter (Signed)
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  He needs this to focus at school.  Can someone other than Dr. Clelia CroftShaw fill his request?   [phone]

## 2015-06-02 NOTE — Telephone Encounter (Signed)
Great, thanks for doing this. Please remind pt to be fasting for his next visit-  If it is not till later in the day, can come by that morning at 8 and have his labs drawn;.

## 2015-06-02 NOTE — Telephone Encounter (Signed)
Meds ordered this encounter  Medications  . methylphenidate 27 MG PO CR tablet    Sig: Take 1 tablet (27 mg total) by mouth every morning.    Dispense:  30 tablet    Refill:  0    Order Specific Question:  Supervising Provider    Answer:  Ellamae SiaOLITTLE, ROBERT P [[id number]]    Please advise the patient that he needs an OV with Dr. Clelia CroftShaw for additional fills.

## 2015-06-02 NOTE — Telephone Encounter (Signed)
Notified pt RF ready and need for f/up w/Dr Clelia CroftShaw. He advised that he has already set up appt for 2/2.

## 2015-06-03 ENCOUNTER — Ambulatory Visit: Payer: BLUE CROSS/BLUE SHIELD | Admitting: Family Medicine

## 2015-06-03 NOTE — Telephone Encounter (Signed)
Called pt and advised message from provider on their voicemail.  

## 2015-07-01 ENCOUNTER — Ambulatory Visit (INDEPENDENT_AMBULATORY_CARE_PROVIDER_SITE_OTHER): Payer: BLUE CROSS/BLUE SHIELD | Admitting: Family Medicine

## 2015-07-01 VITALS — BP 152/92 | HR 112 | Temp 98.5°F | Resp 18 | Ht 72.5 in | Wt 216.0 lb

## 2015-07-01 DIAGNOSIS — F909 Attention-deficit hyperactivity disorder, unspecified type: Secondary | ICD-10-CM | POA: Diagnosis not present

## 2015-07-01 DIAGNOSIS — Z23 Encounter for immunization: Secondary | ICD-10-CM | POA: Diagnosis not present

## 2015-07-01 DIAGNOSIS — F988 Other specified behavioral and emotional disorders with onset usually occurring in childhood and adolescence: Secondary | ICD-10-CM

## 2015-07-01 MED ORDER — METHYLPHENIDATE HCL ER (OSM) 27 MG PO TBCR: 27.0000 mg | EXTENDED_RELEASE_TABLET | ORAL | Status: DC

## 2015-07-01 MED ORDER — METHYLPHENIDATE HCL 10 MG PO TABS: ORAL_TABLET | ORAL | Status: DC

## 2015-07-01 NOTE — Patient Instructions (Signed)
Call in 3 months for additional refills.  I will see you back in 6 months.

## 2015-07-01 NOTE — Progress Notes (Signed)
Subjective:    Patient ID: Nathan [name], male    DOB: May 01, 1986, 30 y.o.   MRN: [id number] Chief Complaint  Patient presents with  . Medication Refill    HPI Only uses the  prn ritalin very rarely - when studying for midterms or on an exam day.  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   Classes are going well - he has settled into He did gain some weight over the holidays with all the parties and family gatherings but is able to work out some and back onto diet. Has had to stop with the [employer]. 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  They are ordering his office furniture this week.  He is watching a lot of the graduating class struggle in looking for jobs and pay back their loanes so is really happy that he is not going to have to worry about this.  His wife is doing well. Is on azathioprine for lupus followed at [facility]. They would like to start trying to conceive in the next year or so so have started meeting with the high risk pregnancy ob group there and they are going to try to wean her off meds so can get pregnant.  She is a Midwife.   Past Medical History  Diagnosis Date  . Anxiety    Past Surgical History  Procedure Laterality Date  . Wisdom tooth extraction     Current Outpatient Prescriptions on File Prior to Visit  Medication Sig Dispense Refill  . methylphenidate (CONCERTA) 27 MG PO CR tablet Take 1 tablet (27 mg total) by mouth every morning. 30 tablet 0   No current facility-administered medications on file prior to visit.   No Known Allergies Family History  Problem Relation Age of Onset  . Cancer Father    Social History   Social History  . Marital Status: Single   Spouse Name: N/A  . Number of Children: N/A  . Years of Education: N/A   Social History Main Topics  . Smoking status: Never Smoker   . Smokeless tobacco: Not on file  . Alcohol Use: No  . Drug Use: Not on file  . Sexual Activity: Yes   Other Topics Concern  . Not on file   Social History Narrative     Review of Systems  Constitutional: Positive for fatigue. Negative for diaphoresis, activity change, appetite change and unexpected weight change.  Respiratory: Negative for chest tightness and shortness of breath.   Cardiovascular: Negative for chest pain and palpitations.  Gastrointestinal: Negative for nausea and vomiting.  Endocrine: Positive for polyphagia.  Neurological: Negative for dizziness, tremors, syncope and light-headedness.  Psychiatric/Behavioral: Positive for decreased concentration. Negative for suicidal ideas, hallucinations, confusion, sleep disturbance, self-injury, dysphoric mood and agitation. The patient is not nervous/anxious and is not hyperactive.        Objective:  BP 152/92 mmHg  Pulse 112  Temp(Src) 98.5 F (36.9 C)  Resp 18  Ht 6' 0.5" (1.842 m)  Wt 216 lb (97.977 kg)  BMI 28.88 kg/m2  Physical Exam  Constitutional: He is oriented to person, place, and time. He appears well-developed and well-nourished. No distress.  HENT:  Head: Normocephalic and atraumatic.  Eyes: Conjunctivae are normal. Pupils are equal, round, and reactive to light. No scleral icterus.  Neck: Normal range of motion. Neck supple. No thyromegaly present.  Cardiovascular: Normal rate, regular rhythm, normal heart sounds and intact distal pulses.   Pulmonary/Chest: Effort normal and breath sounds normal. No respiratory distress.  Musculoskeletal: He exhibits no edema.  Lymphadenopathy:    He has no cervical adenopathy.  Neurological: He is alert and oriented to person, place, and time.  Skin: Skin is warm and dry. He is not diaphoretic.  Psychiatric: He has a normal mood  and affect. His behavior is normal.    BP on recheck: 120/75    Assessment & Plan:   1. Need for prophylactic vaccination and inoculation against influenza   2. ADD (attention deficit disorder)   He is doing great on current regimen. Gave 3 mos worth of rxs. Instructed pt to call in late April for another 3 months of refills then will see pt back in f/u in 6 mos. Initial bp elevated today as pt was anxious about an email - was normal on recheck  Orders Placed This Encounter  Procedures  . Flu Vaccine QUAD 36+ mos IM    Meds ordered this encounter  Medications  . methylphenidate (CONCERTA) 27 MG PO CR tablet    Sig: Take 1 tablet (27 mg total) by mouth every morning.    Dispense:  30 tablet    Refill:  0    May fill 56 days from date written  . methylphenidate (CONCERTA) 27 MG PO CR tablet    Sig: Take 1 tablet (27 mg total) by mouth every morning.    Dispense:  30 tablet    Refill:  0    May fill 28 days from date written  . methylphenidate (CONCERTA) 27 MG PO CR tablet    Sig: Take 1 tablet (27 mg total) by mouth every morning.    Dispense:  30 tablet    Refill:  0  . methylphenidate (RITALIN) 10 MG tablet    Sig: Take at noon as needed for ADD    Dispense:  30 tablet    Refill:  0     Norberto Sorenson, MD MPH

## 2015-10-26 ENCOUNTER — Telehealth: Payer: Self-pay

## 2015-10-26 NOTE — Telephone Encounter (Signed)
No porb. Will print out tomorrow for pick up 5.31

## 2015-10-26 NOTE — Telephone Encounter (Signed)
Patient has an appointment with Dr. Clelia CroftShaw in August. He wants to know if she'll refill concerta until the next appointment.  [phone]

## 2015-10-27 ENCOUNTER — Other Ambulatory Visit: Payer: Self-pay | Admitting: Family Medicine

## 2015-10-27 MED ORDER — METHYLPHENIDATE HCL ER (OSM) 27 MG PO TBCR: 27.0000 mg | EXTENDED_RELEASE_TABLET | ORAL | Status: DC

## 2015-10-27 MED ORDER — METHYLPHENIDATE HCL 10 MG PO TABS: ORAL_TABLET | ORAL | Status: DC

## 2015-10-27 NOTE — Telephone Encounter (Signed)
Please let pt know rx is printed for pickup

## 2015-10-27 NOTE — Telephone Encounter (Signed)
Called patient left message Rx ready for pick up at [facility] in [facility] before 6pm.

## 2015-10-28 NOTE — Telephone Encounter (Signed)
Pt has already picked up Rx for Concerta. I LMOM to advise that evidently the Rx for Ritalin got separated from the Concerta Rxs and was not put in the envelope with the others. It is ready for p/up. Apologized for the inconvenience and asked him to CB if he'd like me to mail it to him.

## 2015-12-30 ENCOUNTER — Ambulatory Visit: Payer: BLUE CROSS/BLUE SHIELD | Admitting: Family Medicine

## 2016-01-13 ENCOUNTER — Ambulatory Visit (INDEPENDENT_AMBULATORY_CARE_PROVIDER_SITE_OTHER): Payer: BLUE CROSS/BLUE SHIELD | Admitting: Family Medicine

## 2016-01-13 ENCOUNTER — Encounter: Payer: Self-pay | Admitting: Family Medicine

## 2016-01-13 VITALS — BP 142/72 | HR 83 | Temp 98.4°F | Resp 18 | Ht 72.5 in | Wt 228.0 lb

## 2016-01-13 DIAGNOSIS — F429 Obsessive-compulsive disorder, unspecified: Secondary | ICD-10-CM | POA: Diagnosis not present

## 2016-01-13 DIAGNOSIS — K599 Functional intestinal disorder, unspecified: Secondary | ICD-10-CM | POA: Diagnosis not present

## 2016-01-13 DIAGNOSIS — R140 Abdominal distension (gaseous): Secondary | ICD-10-CM | POA: Diagnosis not present

## 2016-01-13 DIAGNOSIS — F988 Other specified behavioral and emotional disorders with onset usually occurring in childhood and adolescence: Secondary | ICD-10-CM

## 2016-01-13 DIAGNOSIS — F909 Attention-deficit hyperactivity disorder, unspecified type: Secondary | ICD-10-CM | POA: Diagnosis not present

## 2016-01-13 LAB — COMPREHENSIVE METABOLIC PANEL
ALT: 38 U/L (ref 9–46)
AST: 22 U/L (ref 10–40)
Albumin: 4.6 g/dL (ref 3.6–5.1)
Alkaline Phosphatase: 50 U/L (ref 40–115)
BUN: 10 mg/dL (ref 7–25)
CHLORIDE: 105 mmol/L (ref 98–110)
CO2: 21 mmol/L (ref 20–31)
Calcium: 9.6 mg/dL (ref 8.6–10.3)
Creat: 0.82 mg/dL (ref 0.60–1.35)
GLUCOSE: 87 mg/dL (ref 65–99)
POTASSIUM: 4.2 mmol/L (ref 3.5–5.3)
Sodium: 138 mmol/L (ref 135–146)
Total Bilirubin: 1.5 mg/dL — ABNORMAL HIGH (ref 0.2–1.2)
Total Protein: 7.2 g/dL (ref 6.1–8.1)

## 2016-01-13 LAB — CBC WITH DIFFERENTIAL/PLATELET
BASOS ABS: 77 {cells}/uL (ref 0–200)
Basophils Relative: 1 %
EOS ABS: 154 {cells}/uL (ref 15–500)
EOS PCT: 2 %
HCT: 46.7 % (ref 38.5–50.0)
Hemoglobin: 16.3 g/dL (ref 13.2–17.1)
LYMPHS PCT: 31 %
Lymphs Abs: 2387 cells/uL (ref 850–3900)
MCH: 31.3 pg (ref 27.0–33.0)
MCHC: 34.9 g/dL (ref 32.0–36.0)
MCV: 89.6 fL (ref 80.0–100.0)
MPV: 10.2 fL (ref 7.5–12.5)
Monocytes Absolute: 770 cells/uL (ref 200–950)
Monocytes Relative: 10 %
Neutro Abs: 4312 cells/uL (ref 1500–7800)
Neutrophils Relative %: 56 %
PLATELETS: 251 10*3/uL (ref 140–400)
RBC: 5.21 MIL/uL (ref 4.20–5.80)
RDW: 13.2 % (ref 11.0–15.0)
WBC: 7.7 10*3/uL (ref 3.8–10.8)

## 2016-01-13 LAB — TSH: TSH: 1.6 mIU/L (ref 0.40–4.50)

## 2016-01-13 LAB — C-REACTIVE PROTEIN: CRP: <0.5 mg/dL (ref <0.60)

## 2016-01-13 MED ORDER — METHYLPHENIDATE HCL 10 MG PO TABS: ORAL_TABLET | ORAL | 0 refills | Status: DC

## 2016-01-13 MED ORDER — METHYLPHENIDATE HCL ER 27 MG PO TB24: 27.0000 mg | ORAL_TABLET | Freq: Every day | ORAL | 0 refills | Status: DC

## 2016-01-13 MED ORDER — METHYLPHENIDATE HCL ER (OSM) 27 MG PO TBCR: 27.0000 mg | EXTENDED_RELEASE_TABLET | ORAL | 0 refills | Status: DC

## 2016-01-13 MED ORDER — SERTRALINE HCL 25 MG PO TABS: 25.0000 mg | ORAL_TABLET | Freq: Every day | ORAL | 3 refills | Status: DC

## 2016-01-13 MED ORDER — METHYLPHENIDATE HCL ER 27 MG PO TB24
27.0000 mg | ORAL_TABLET | Freq: Every day | ORAL | 0 refills | Status: DC
Start: 2016-01-13 — End: 2016-06-26

## 2016-01-13 NOTE — Patient Instructions (Signed)
     IF you received an x-ray today, you will receive an invoice from [facility]. Please contact [facility] at [phone] with questions or concerns regarding your invoice.   IF you received labwork today, you will receive an invoice from [facility]. Please contact [facility] at [phone] with questions or concerns regarding your invoice.   Our billing staff will not be able to assist you with questions regarding bills from these companies.  You will be contacted with the lab results as soon as they are available. The fastest way to get your results is to activate your My Chart account. Instructions are located on the last page of this paperwork. If you have not heard from us regarding the results in 2 weeks, please contact this office.      

## 2016-01-13 NOTE — Progress Notes (Signed)
Subjective:    Patient ID: Nathan [name], male    DOB: 1985/10/22, 30 y.o.   MRN: [id number] Chief Complaint  Patient presents with  . Medication Refill    Concerta 27mg   ER (insurance will not cover the CR)    HPI   Nathan [name] is an absolutely delightful 30 yo male here to f/u on his chronic medical conditions.  He is overall doing quite well considering.  His wife Nathan [name] has been struggling with fairly severe lupus and she actually had a stroke sev mos ago - for the first 24 hrs they thought she had meningitis. Still not completely sure why or how it happened.  She teaches elementary school and has been taken out on disability at least this semester.  She is working hard at PT/OT/ST and is really making a great recovery though she is still having some difficulty processing and thinking as quickly as she did prior and is moving slowly but overall her prognosis is good.  She is mainly followed at [facility] and has family in [place] so her parents have been able to help provide a lot of support during this time as well as Nathan [name] is so busy.  They would love to have kids but could be difficult and dangerous for Nathan [name] so just taking it year by year.  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 He has another sev classes this fall and then will spend the winter cramming for boards.    He notices he has been gaining weight again with decreased time to exercise but knows to make this a priority as his weight has  bounced around considerably under the demands of law school and having to resign from the [employer] reserves.  Has been noticing more of his OCD sxs coming out - as he has more stress and less time to process things mentally, he finds himself counting, tapping, etc - thinks it is getting to the point where it is starting to impair him and so would like to consider treatment.  Would like to do behavioral interventions but will be difficult due to his limited time so open to medications as well - hopefully together he will get the best result.  Has had lifelong GI sxs but are sig worsening.  Having a lot of bloating, flatulence, and cramping pain. Has not been able to connect worsening sxs to certain foods like gluten. Thinks he does have some degree of lactose intolerance.  [facility] 01/13/2016 07/01/2015 10/02/2014  Decreased Interest 0 0 0  Down, Depressed, Hopeless 0 0 0  PHQ - 2 Score 0 0 0   Past Medical History:  Diagnosis Date  . Anxiety    Past Surgical History:  Procedure Laterality Date  . WISDOM TOOTH EXTRACTION     No current outpatient prescriptions on file prior to visit.   No current facility-administered medications on file prior to visit.  No Known Allergies Family History  Problem Relation Age of Onset  . Cancer Father    Social History   Social History  . Marital status: Married    Spouse name: N/A  . Number of children: N/A  . Years of education: N/A   Social History Main Topics  . Smoking status: Never Smoker  . Smokeless tobacco: None  . Alcohol use No  . Drug use: Unknown  . Sexual activity: Yes   Other Topics Concern  . None   Social History Narrative  . None     Review of Systems  Constitutional: Positive for activity change, fatigue and unexpected weight change. Negative for appetite change, chills, diaphoresis and fever.  Respiratory: Negative for cough, chest tightness and shortness of breath.   Cardiovascular: Negative for  chest pain, palpitations and leg swelling.  Gastrointestinal: Positive for abdominal distention, abdominal pain, constipation, diarrhea and nausea. Negative for anal bleeding, blood in stool, rectal pain and vomiting.  Neurological: Negative for dizziness, tremors, syncope, weakness, light-headedness, numbness and headaches.  Psychiatric/Behavioral: Positive for behavioral problems and decreased concentration. Negative for agitation, confusion, dysphoric mood, hallucinations, self-injury, sleep disturbance and suicidal ideas. The patient is nervous/anxious. The patient is not hyperactive.        Objective:   Physical Exam  Constitutional: He is oriented to person, place, and time. He appears well-developed and well-nourished. No distress.  HENT:  Head: Normocephalic and atraumatic.  Eyes: Conjunctivae are normal. Pupils are equal, round, and reactive to light. No scleral icterus.  Neck: Normal range of motion. Neck supple. No thyromegaly present.  Cardiovascular: Normal rate, regular rhythm, normal heart sounds and intact distal pulses.   Pulmonary/Chest: Effort normal and breath sounds normal. No respiratory distress.  Abdominal: Soft. Normal appearance and bowel sounds are normal. He exhibits no distension and no mass. There is no hepatosplenomegaly. There is no tenderness. There is no rebound, no guarding and no CVA tenderness.  Musculoskeletal: He exhibits no edema.  Lymphadenopathy:    He has no cervical adenopathy.  Neurological: He is alert and oriented to person, place, and time.  Skin: Skin is warm and dry. He is not diaphoretic.  Psychiatric: He has a normal mood and affect. His behavior is normal.   BP (!) 142/72   Pulse 83   Temp 98.4 F (36.9 C) (Oral)   Resp 18   Ht 6' 0.5" (1.842 m)   Wt 228 lb (103.4 kg)   SpO2 97%   BMI 30.50 kg/m      Assessment & Plan:  Over 40 min spent in face-to-face evaluation of and consultation with patient and coordination of care.  Over  50% of this time was spent counseling this patient.  1. Abdominal bloating   2. Bowel dysfunction - refer to GI for further eval - rec Dr. Myrtie Neitheranis  3. ADD (attention deficit disorder) - refilled current regimen of concerta 27 qam and ritalin ir 10 qafternoon.    4. OCD (obsessive compulsive disorder) - no prior trials of ssri - start zoloft and refer for CBT    Orders Placed This Encounter  Procedures  . Sedimentation Rate  . C-reactive protein  . Fecal lactoferrin, quant  . Celiac panel 10  . CBC with Differential/Platelet  . Comprehensive metabolic panel  . TSH  . POC Hemoccult Bld/Stl (3-Cd Home Screen)    Standing Status:   Future    Standing Expiration Date:   01/12/2017    Meds ordered this encounter  Medications  .  methylphenidate (RITALIN) 10 MG tablet    Sig: Take at noon as needed for ADD.    Dispense:  30 tablet    Refill:  0    May fill in 28 days from date written  . methylphenidate (RITALIN) 10 MG tablet    Sig: Take at noon as needed for ADD    Dispense:  30 tablet    Refill:  0    May fill in 56 days after date written  . methylphenidate (RITALIN) 10 MG tablet    Sig: Take 1 tab at noon for ADD    Dispense:  30 tablet    Refill:  0    May fill in 80days from date wirten  . methylphenidate (RITALIN) 10 MG tablet    Sig: Take at noon as needed for ADD    Dispense:  30 tablet    Refill:  0  . DISCONTD: methylphenidate (CONCERTA) 27 MG PO CR tablet    Sig: Take 1 tablet (27 mg total) by mouth every morning.    Dispense:  30 tablet    Refill:  0    May fill 80 days from date written  . DISCONTD: methylphenidate (CONCERTA) 27 MG PO CR tablet    Sig: Take 1 tablet (27 mg total) by mouth every morning.    Dispense:  30 tablet    Refill:  0    May fill 56 days from date written  . DISCONTD: methylphenidate (CONCERTA) 27 MG PO CR tablet    Sig: Take 1 tablet (27 mg total) by mouth every morning.    Dispense:  30 tablet    Refill:  0    May fill 28 days from  date written  . DISCONTD: methylphenidate (CONCERTA) 27 MG PO CR tablet    Sig: Take 1 tablet (27 mg total) by mouth every morning.    Dispense:  30 tablet    Refill:  0  . sertraline (ZOLOFT) 25 MG tablet    Sig: Take 1 tablet (25 mg total) by mouth at bedtime.    Dispense:  30 tablet    Refill:  3  . methylphenidate 27 MG PO TB24    Sig: Take 1 tablet (27 mg total) by mouth daily.    Dispense:  30 tablet    Refill:  0  . methylphenidate 27 MG PO TB24    Sig: Take 1 tablet (27 mg total) by mouth daily.    Dispense:  30 tablet    Refill:  0    May fill 28 days after date written  . methylphenidate 27 MG PO TB24    Sig: Take 1 tablet (27 mg total) by mouth daily.    Dispense:  30 tablet    Refill:  0    May fill 56 days from date written  . methylphenidate 27 MG PO TB24    Sig: Take 1 tablet (27 mg total) by mouth daily.    Dispense:  30 tablet    Refill:  0    May fill 80 days after date written    Norberto Sorenson, M.D.  [facility] [address] [place], [place] [address] [phone] phone [phone] fax  01/31/16 9:23 AM

## 2016-01-14 LAB — CELIAC PANEL 10
Endomysial Screen: NEGATIVE
GLIADIN IGG: 2 U (ref <20)
Gliadin IgA: 4 Units (ref <20)
IGA: 208 mg/dL (ref 81–463)
TISSUE TRANSGLUT AB: 1 U/mL (ref <6)
Tissue Transglutaminase Ab, IgA: 1 U/mL (ref <4)

## 2016-01-14 LAB — SEDIMENTATION RATE: SED RATE: 1 mm/h (ref 0–15)

## 2016-01-19 ENCOUNTER — Encounter: Payer: Self-pay | Admitting: Family Medicine

## 2016-02-16 LAB — POC HEMOCCULT BLD/STL (HOME/3-CARD/SCREEN)
FECAL OCCULT BLD: NEGATIVE
FECAL OCCULT BLD: NEGATIVE

## 2016-02-16 NOTE — Addendum Note (Signed)
Addended by: Thelma BargeICHARDSON, Kamiah Fite D on: 02/16/2016 12:04 PM   Modules accepted: Orders

## 2016-03-16 ENCOUNTER — Encounter: Payer: Self-pay | Admitting: Family Medicine

## 2016-05-19 ENCOUNTER — Other Ambulatory Visit: Payer: Self-pay

## 2016-05-19 NOTE — Telephone Encounter (Signed)
Patient is calling to request a prescription for one month of methylphenidate. This is due to be filled in February. 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  He also needs Ritalin 10MG  soon as possible.  [phone]

## 2016-05-23 NOTE — Telephone Encounter (Signed)
12/2015 last ov and labs  

## 2016-05-27 MED ORDER — METHYLPHENIDATE HCL ER 27 MG PO TB24: 27.0000 mg | ORAL_TABLET | Freq: Every day | ORAL | 0 refills | Status: DC

## 2016-05-27 MED ORDER — METHYLPHENIDATE HCL 10 MG PO TABS: ORAL_TABLET | ORAL | 0 refills | Status: DC

## 2016-06-16 ENCOUNTER — Encounter: Payer: Self-pay | Admitting: Family Medicine

## 2016-06-26 ENCOUNTER — Telehealth: Payer: Self-pay

## 2016-06-26 MED ORDER — METHYLPHENIDATE HCL 10 MG PO TABS: ORAL_TABLET | ORAL | 0 refills | Status: DC

## 2016-06-26 MED ORDER — METHYLPHENIDATE HCL ER 27 MG PO TB24: 27.0000 mg | ORAL_TABLET | Freq: Every day | ORAL | 0 refills | Status: DC

## 2016-06-26 MED ORDER — METHYLPHENIDATE HCL 10 MG PO TABS: 10.0000 mg | ORAL_TABLET | Freq: Three times a day (TID) | ORAL | 0 refills | Status: DC

## 2016-06-26 NOTE — Telephone Encounter (Signed)
Patient is calling to request a refill for his 27mg  Conserta and 27mg  methylphenidate prescriptions.  Please advise  [phone]

## 2016-06-26 NOTE — Telephone Encounter (Signed)
Refill printed and is ready for pick-up at [facility]. Sent pt MyChart message as well.

## 2016-09-28 ENCOUNTER — Ambulatory Visit (INDEPENDENT_AMBULATORY_CARE_PROVIDER_SITE_OTHER): Payer: BLUE CROSS/BLUE SHIELD | Admitting: Family Medicine

## 2016-09-28 ENCOUNTER — Encounter: Payer: Self-pay | Admitting: Family Medicine

## 2016-09-28 VITALS — BP 137/90 | HR 86 | Temp 98.9°F | Resp 18 | Ht 72.91 in | Wt 229.4 lb

## 2016-09-28 DIAGNOSIS — Z79899 Other long term (current) drug therapy: Secondary | ICD-10-CM

## 2016-09-28 DIAGNOSIS — F988 Other specified behavioral and emotional disorders with onset usually occurring in childhood and adolescence: Secondary | ICD-10-CM | POA: Diagnosis not present

## 2016-09-28 DIAGNOSIS — Z5181 Encounter for therapeutic drug level monitoring: Secondary | ICD-10-CM

## 2016-09-28 DIAGNOSIS — N521 Erectile dysfunction due to diseases classified elsewhere: Secondary | ICD-10-CM

## 2016-09-28 MED ORDER — METHYLPHENIDATE HCL ER (OSM) 27 MG PO TBCR: 27.0000 mg | EXTENDED_RELEASE_TABLET | ORAL | 0 refills | Status: DC

## 2016-09-28 MED ORDER — SILDENAFIL CITRATE 20 MG PO TABS: ORAL_TABLET | ORAL | 2 refills | Status: AC

## 2016-09-28 MED ORDER — METHYLPHENIDATE HCL 10 MG PO TABS: ORAL_TABLET | ORAL | 0 refills | Status: DC

## 2016-09-28 MED ORDER — METHYLPHENIDATE HCL ER 27 MG PO TB24: 27.0000 mg | ORAL_TABLET | Freq: Every day | ORAL | 0 refills | Status: DC

## 2016-09-28 MED ORDER — METHYLPHENIDATE HCL 10 MG PO TABS: 10.0000 mg | ORAL_TABLET | Freq: Three times a day (TID) | ORAL | 0 refills | Status: DC

## 2016-09-28 NOTE — Patient Instructions (Signed)
[facility] in [place] for the sildenafil.

## 2016-09-28 NOTE — Progress Notes (Signed)
Subjective:    Patient ID: Nathan [name], male    DOB: 1986/04/06, 31 y.o.   MRN: [id number] Chief Complaint  Patient presents with  . Medication Refill    Ritalin     HPI  Nathan [name] is a delightful 31 yo male here to follow-up on his chronic medical conditions and adult ADD.    Nathan [name] took his Bar Exam 2 mos ago and passed!!!! Only 29% of the people who took it the first time did and Nathan [name] was one of them!. Nathan [name] was on Concerto 27mg  qam with methylphenidate 10mg  qlunch which we increased to bid during his bar exam study. For the past 2 months Nathan [name] has just been using the Concerta ER 27mg  - use substantially dropped off after Jan/Feb (bar exam in late Feb).  Is back doing the 27mg  in the morning and 10 around lunch.    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    Abd bloating: referred pt to Dr. Myrtie [facility]anis at [facility] but Nathan [name] never scheduled an appointment due to time restrictions. Stomach about the same - seems stress induced - get worse when Nathan [name] is going to play a show. Fine with putting up with it for now.  Joni Reiningicole is pregnant!!!!!!!!!!!!!!!!!!!!!!!!!!!!!!!! - lupus is in remission - all of her doctors were pretty surprised by how well she has done - it happened very quickly and her doctors were not aware of the initial attempt at conception but things did not be going more smoothly. They're having a girl - due 8/15.    Has not started zoloft yet.  Has it at home. Doesn't know if Nathan [name] wants to her needs to. Thinks Nathan [name] might. Nathan [name] has started be able to exercise again finally. But just restarting on this. Nathan [name] knows Nathan [name] needs to lose some weight.  Past Medical History:  Diagnosis Date  . Anxiety    Past Surgical History:  Procedure Laterality Date  . WISDOM TOOTH EXTRACTION     Current Outpatient Prescriptions on File Prior to Visit    Medication Sig Dispense Refill  . sertraline (ZOLOFT) 25 MG tablet Take 1 tablet (25 mg total) by mouth at bedtime. 30 tablet 3   No current facility-administered medications on file prior to visit.    No Known Allergies Family History  Problem Relation Age of Onset  . Cancer Father    Social History   Social History  . Marital status: Married    Spouse name: N/A  . Number of children: N/A  . Years of education: N/A   Social History Main Topics  . Smoking status: Never Smoker  . Smokeless tobacco: Never Used  . Alcohol use No  . Drug use: Unknown  . Sexual activity: Yes   Other Topics Concern  . None   Social History Narrative  . None   [facility] 09/28/2016 01/13/2016 07/01/2015 10/02/2014  Decreased Interest 0 0 0 0  Down, Depressed, Hopeless 0 0 0 0  PHQ - 2 Score 0 0 0 0     Review of Systems See hpi    Objective:   Physical Exam  Constitutional: Nathan [name] is oriented to person, place, and time. Nathan [name] appears well-developed and well-nourished. No distress.  HENT:  Head: Normocephalic and atraumatic.  Eyes: Conjunctivae  are normal. Pupils are equal, round, and reactive to light. No scleral icterus.  Neck: Normal range of motion. Neck supple. No thyromegaly present.  Cardiovascular: Normal rate, regular rhythm, normal heart sounds and intact distal pulses.   Pulmonary/Chest: Effort normal and breath sounds normal. No respiratory distress.  Musculoskeletal: Nathan [name] exhibits no edema.  Lymphadenopathy:    Nathan [name] has no cervical adenopathy.  Neurological: Nathan [name] is alert and oriented to person, place, and time.  Skin: Skin is warm and dry. Nathan [name] is not diaphoretic.  Psychiatric: Nathan [name] has a normal mood and affect. His behavior is normal.      BP 137/90   Pulse 86   Temp 98.9 F (37.2 C) (Oral)   Resp 18   Ht 6' 0.91" (1.852 m)   Wt 229 lb 6.4 oz (104.1 kg)   SpO2 96%   BMI 30.34 kg/m      Assessment & Plan:   1. Attention deficit disorder (ADD) without  hyperactivity - doing well, cont current regimen. Did give 1 rx with additional IR ritalin for him to use sparingly on very long days which I anticipate Nathan [name] will have more off with first daughter due in 3 mos!!!!  2. Medication monitoring encounter   3. High risk medication use   4.      ED - mild; pt thinks secondary to ADD - hard to focus in bed and put aside all the other thoughts of the day. Libido fine. Occ mild problem but would like to try a PDE - sent sildenafil to [facility] in [place] - reviewed risks and use in detail.  Meds ordered this encounter  Medications  . DISCONTD: methylphenidate 27 MG PO TB24    Sig: Take 1 tablet (27 mg total) by mouth daily.    Dispense:  30 tablet    Refill:  0    May fill 56 days from date written  . DISCONTD: methylphenidate 27 MG PO TB24    Sig: Take 1 tablet (27 mg total) by mouth daily.    Dispense:  30 tablet    Refill:  0    May fill 80 days after date written  . DISCONTD: methylphenidate 27 MG PO TB24    Sig: Take 1 tablet (27 mg total) by mouth daily.    Dispense:  30 tablet    Refill:  0  . DISCONTD: methylphenidate 27 MG PO TB24    Sig: Take 1 tablet (27 mg total) by mouth daily.    Dispense:  30 tablet    Refill:  0    May fill 25 days after date written  . methylphenidate (RITALIN) 10 MG tablet    Sig: Take 1 tablet (10 mg total) by mouth 3 (three) times daily with meals. As needed to augment Concerta    Dispense:  90 tablet    Refill:  0  . methylphenidate (RITALIN) 10 MG tablet    Sig: Take at noon as needed for ADD    Dispense:  30 tablet    Refill:  0    May fill in 56 days after date written  . methylphenidate (RITALIN) 10 MG tablet    Sig: Take 1 tab at noon for ADD    Dispense:  30 tablet    Refill:  0    May fill in 80days from date wirten  . methylphenidate (RITALIN) 10 MG tablet    Sig: Take at noon as needed for ADD    Dispense:  30 tablet  Refill:  0    May fill 25 days after date written  .  sildenafil (REVATIO) 20 MG tablet    Sig: Take as directed by physician    Dispense:  30 tablet    Refill:  2  . DISCONTD: methylphenidate (CONCERTA) 27 MG PO CR tablet    Sig: Take 1 tablet (27 mg total) by mouth every morning.    Dispense:  30 tablet    Refill:  0  . methylphenidate (CONCERTA) 27 MG PO CR tablet    Sig: Take 1 tablet (27 mg total) by mouth every morning.    Dispense:  30 tablet    Refill:  0    May fill 52 days from date written  . methylphenidate (CONCERTA) 27 MG PO CR tablet    Sig: Take 1 tablet (27 mg total) by mouth every morning.    Dispense:  30 tablet    Refill:  0    May fill 25 days from date written  . methylphenidate (CONCERTA) 27 MG PO CR tablet    Sig: Take 1 tablet (27 mg total) by mouth every morning.    Dispense:  30 tablet    Refill:  0    May fill 80 days from date written    Today I have utilized the [place] Controlled Substance Registry's online query to confirm compliance regarding the patient's narcotic pain medications. My review reveals appropriate prescription fills and that [facility] is the sole provider of these medications. Rechecks will occur regularly and the patient is aware of our use of the system.  Norberto Sorenson, M.D.  Primary Care at [facility] [address] [place], [place] [address] [phone] phone [phone] fax  09/30/16 11:20 PM

## 2017-01-25 ENCOUNTER — Telehealth: Payer: Self-pay | Admitting: Family Medicine

## 2017-01-25 NOTE — Telephone Encounter (Signed)
Pt is needing to talk with someone regarding his refill on concerta and when he can come in for an appt   Best [phone] or [phone]

## 2017-01-26 NOTE — Telephone Encounter (Signed)
Pt is completely out of his pills and he has an appointment scheduled for 01/31/17. He would like to know if he could have a couple of pills to last him until 9/5. Please advise at [phone]

## 2017-01-26 NOTE — Telephone Encounter (Signed)
Spoke to patient's wife and Corrie DandyMary scheduled an appointment time for patient to come into clinic.

## 2017-01-31 ENCOUNTER — Encounter: Payer: Self-pay | Admitting: Family Medicine

## 2017-01-31 ENCOUNTER — Encounter: Payer: Self-pay | Admitting: Gastroenterology

## 2017-01-31 ENCOUNTER — Ambulatory Visit (INDEPENDENT_AMBULATORY_CARE_PROVIDER_SITE_OTHER): Payer: BLUE CROSS/BLUE SHIELD | Admitting: Family Medicine

## 2017-01-31 ENCOUNTER — Ambulatory Visit (INDEPENDENT_AMBULATORY_CARE_PROVIDER_SITE_OTHER): Payer: BLUE CROSS/BLUE SHIELD

## 2017-01-31 VITALS — BP 150/91 | HR 74 | Temp 98.3°F | Resp 18 | Ht 71.0 in | Wt 228.2 lb

## 2017-01-31 DIAGNOSIS — R1032 Left lower quadrant pain: Secondary | ICD-10-CM | POA: Diagnosis not present

## 2017-01-31 DIAGNOSIS — R109 Unspecified abdominal pain: Secondary | ICD-10-CM | POA: Diagnosis not present

## 2017-01-31 DIAGNOSIS — R1012 Left upper quadrant pain: Secondary | ICD-10-CM

## 2017-01-31 DIAGNOSIS — F988 Other specified behavioral and emotional disorders with onset usually occurring in childhood and adolescence: Secondary | ICD-10-CM

## 2017-01-31 LAB — POCT CBC
Granulocyte percent: 67.4 %G (ref 37–80)
HEMATOCRIT: 45.3 % (ref 43.5–53.7)
HEMOGLOBIN: 15.6 g/dL (ref 14.1–18.1)
LYMPH, POC: 2 (ref 0.6–3.4)
MCH, POC: 30.1 pg (ref 27–31.2)
MCHC: 34.4 g/dL (ref 31.8–35.4)
MCV: 87.5 fL (ref 80–97)
MID (cbc): 0.2 (ref 0–0.9)
MPV: 7.2 fL (ref 0–99.8)
PLATELET COUNT, POC: 265 10*3/uL (ref 142–424)
POC Granulocyte: 4.4 (ref 2–6.9)
POC LYMPH PERCENT: 29.8 %L (ref 10–50)
POC MID %: 2.8 %M (ref 0–12)
RBC: 5.17 M/uL (ref 4.69–6.13)
RDW, POC: 12.3 %
WBC: 6.6 10*3/uL (ref 4.6–10.2)

## 2017-01-31 LAB — COMPREHENSIVE METABOLIC PANEL
ALT: 43 IU/L (ref 0–44)
AST: 25 IU/L (ref 0–40)
Albumin/Globulin Ratio: 2.5 — ABNORMAL HIGH (ref 1.2–2.2)
Albumin: 5 g/dL (ref 3.5–5.5)
Alkaline Phosphatase: 56 IU/L (ref 39–117)
BUN/Creatinine Ratio: 14 (ref 9–20)
BUN: 13 mg/dL (ref 6–20)
Bilirubin Total: 0.8 mg/dL (ref 0.0–1.2)
CALCIUM: 9.6 mg/dL (ref 8.7–10.2)
CO2: 23 mmol/L (ref 20–29)
CREATININE: 0.92 mg/dL (ref 0.76–1.27)
Chloride: 102 mmol/L (ref 96–106)
GFR calc Af Amer: 128 mL/min/{1.73_m2} (ref >59)
GFR, EST NON AFRICAN AMERICAN: 110 mL/min/{1.73_m2} (ref >59)
GLUCOSE: 88 mg/dL (ref 65–99)
Globulin, Total: 2 g/dL (ref 1.5–4.5)
Potassium: 4.2 mmol/L (ref 3.5–5.2)
Sodium: 139 mmol/L (ref 134–144)
Total Protein: 7 g/dL (ref 6.0–8.5)

## 2017-01-31 LAB — POCT URINALYSIS DIP (MANUAL ENTRY)
BILIRUBIN UA: NEGATIVE
BILIRUBIN UA: NEGATIVE mg/dL
Blood, UA: NEGATIVE
Glucose, UA: NEGATIVE mg/dL
Leukocytes, UA: NEGATIVE
Nitrite, UA: NEGATIVE
PROTEIN UA: NEGATIVE mg/dL
SPEC GRAV UA: 1.015 (ref 1.010–1.025)
Urobilinogen, UA: 0.2 E.U./dL
pH, UA: 7 (ref 5.0–8.0)

## 2017-01-31 LAB — POC MICROSCOPIC URINALYSIS (UMFC): MUCUS RE: ABSENT

## 2017-01-31 LAB — POC HEMOCCULT BLD/STL (OFFICE/1-CARD/DIAGNOSTIC): FECAL OCCULT BLD: NEGATIVE

## 2017-01-31 MED ORDER — CONCERTA 27 MG PO TBCR: 27.0000 mg | EXTENDED_RELEASE_TABLET | ORAL | 0 refills | Status: DC

## 2017-01-31 MED ORDER — METHYLPHENIDATE HCL 10 MG PO TABS: ORAL_TABLET | ORAL | 0 refills | Status: DC

## 2017-01-31 MED ORDER — FIBER PO POWD: ORAL | 0 refills | Status: AC

## 2017-01-31 NOTE — Progress Notes (Addendum)
Subjective:  By signing my name below, I, Nathan [name], attest that this documentation has been prepared under the direction and in the presence of Nathan SorensonEva Nakiya Rallis, MD. Electronically Signed: Stann Oresung-Kai [name], Scribe. 01/31/2017 , 10:37 AM .  Patient was seen in Room 2 .   Patient ID: Nathan [name], male    DOB: [birth year], 31 y.o.   MRN: [id number] Chief Complaint  Patient presents with  . Medication Refill    concerta and ritalin   . Abdominal Pain    lower left sided abdominal pain, family hx of diverticulitis    HPI Nathan [name] is a 31 y.o. male who [facility] complaining of LLQ abdominal pain. He has history of recurrent abdominal pain, referred to GI prior but never scheduled. 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 He also just had a daughter with his wife, Nathan [name], who suffers from severe lupus and actually had a severe stroke in summer 2017 that took her out of work as an Tourist information centre managerelementary school teacher. His daughter was born on Aug 2nd, named Nathan [name]. He mentions his wife is doing better recently.   Has had lifelong GI sxs but are sig worsening.  Having a lot of bloating, flatulence, and cramping pain. Has not been able to connect worsening sxs to certain foods like gluten. Thinks he does have some degree of lactose intolerance. Family history of diverticulitis.   He reports feeling bloated since Friday (5 days ago), thought it was gas. Has had some temporary relief passing gas, but hasn't resolved, as pressure and bloating have been constant. He hasn't been eating much, but his appetite has been about the same. He mentions bowels have been normal for him without any changes- usually really loose. He has 2-3 stools a day. He denies urgency. He denies anything that causes bloating or pain to worse. He denies blood in stool, or dark tarry stools. He denies any changes in urine, as he's staying  hydrated.   He's doing well on Ritalin and Concerta. He ran out of medications on Thurs/Fri (5-6 days ago). He never started on Zoloft.   Past Medical History:  Diagnosis Date  . Anxiety    Prior to Admission medications   Medication Sig Start Date End Date Taking? Authorizing Provider  methylphenidate (CONCERTA) 27 MG PO CR tablet Take 1 tablet (27 mg total) by mouth every morning. 09/28/16   Sherren MochaShaw, Alvira Hecht N, MD  methylphenidate (CONCERTA) 27 MG PO CR tablet Take 1 tablet (27 mg total) by mouth every morning. 09/28/16   Sherren MochaShaw, Aadil Sur N, MD  methylphenidate (CONCERTA) 27 MG PO CR tablet Take 1 tablet (27 mg total) by mouth every morning. 09/28/16   Sherren MochaShaw, Keanu Frickey N, MD  methylphenidate (RITALIN) 10 MG tablet Take 1 tablet (10 mg total) by mouth 3 (three) times daily with meals. As needed to augment Concerta 09/28/16   Sherren MochaShaw, Markella Dao N, MD  methylphenidate (RITALIN) 10 MG tablet Take at noon as needed for ADD 09/28/16   Sherren MochaShaw, Liyat Faulkenberry N, MD  methylphenidate (RITALIN) 10 MG tablet Take 1 tab at noon for ADD 09/28/16   Sherren MochaShaw, Payne Garske N, MD  methylphenidate (RITALIN) 10 MG tablet Take at noon as needed for ADD 09/28/16   Sherren MochaShaw, Sharine Cadle N, MD  sertraline (ZOLOFT) 25 MG tablet Take 1 tablet (25 mg total) by mouth at bedtime. 01/13/16   Sherren MochaShaw, Kruze Atchley N, MD  sildenafil (REVATIO) 20  MG tablet Take as directed by physician 09/28/16   Sherren Mocha, MD   No Known Allergies  Review of Systems  Constitutional: Negative for fatigue and unexpected weight change.  Eyes: Negative for visual disturbance.  Respiratory: Negative for cough, chest tightness and shortness of breath.   Cardiovascular: Negative for chest pain, palpitations and leg swelling.  Gastrointestinal: Positive for abdominal distention and abdominal pain. Negative for blood in stool, constipation, diarrhea, nausea and vomiting.  Genitourinary: Negative for dysuria, hematuria and urgency.  Neurological: Negative for dizziness, light-headedness and headaches.       Objective:   Physical  Exam  Constitutional: He is oriented to person, place, and time. He appears well-developed and well-nourished. No distress.  HENT:  Head: Normocephalic and atraumatic.  Eyes: Pupils are equal, round, and reactive to light. EOM are normal.  Neck: Neck supple.  Cardiovascular: Normal rate.   Pulmonary/Chest: Effort normal. No respiratory distress.  Abdominal: Soft. He exhibits no distension. Bowel sounds are increased. There is no rebound and no guarding.  Hyperactive bowel sounds with tenderness to palpation of left flank, and LLQ more than upper; no rebound, guarding or referred pain; soft non distended  Genitourinary:  Genitourinary Comments: Normal rectal exam, no stool involved  Musculoskeletal: Normal range of motion.  Neurological: He is alert and oriented to person, place, and time.  Skin: Skin is warm and dry.  Psychiatric: He has a normal mood and affect. His behavior is normal.  Nursing note and vitals reviewed.   BP (!) 150/91   Pulse 74   Temp 98.3 F (36.8 C) (Oral)   Resp 18   Ht 5\' 11"  (1.803 m)   Wt 228 lb 3.2 oz (103.5 kg)   SpO2 95%   BMI 31.83 kg/m    Results for orders placed or performed in visit on 01/31/17  Comprehensive metabolic panel  Result Value Ref Range   Glucose 88 65 - 99 mg/dL   BUN 13 6 - 20 mg/dL   Creatinine, Ser 1.61 0.76 - 1.27 mg/dL   GFR calc non Af Amer 110 >59 mL/min/1.73   GFR calc Af Amer 128 >59 mL/min/1.73   BUN/Creatinine Ratio 14 9 - 20   Sodium 139 134 - 144 mmol/L   Potassium 4.2 3.5 - 5.2 mmol/L   Chloride 102 96 - 106 mmol/L   CO2 23 20 - 29 mmol/L   Calcium 9.6 8.7 - 10.2 mg/dL   Total Protein 7.0 6.0 - 8.5 g/dL   Albumin 5.0 3.5 - 5.5 g/dL   Globulin, Total 2.0 1.5 - 4.5 g/dL   Albumin/Globulin Ratio 2.5 (H) 1.2 - 2.2   Bilirubin Total 0.8 0.0 - 1.2 mg/dL   Alkaline Phosphatase 56 39 - 117 IU/L   AST 25 0 - 40 IU/L   ALT 43 0 - 44 IU/L  H. pylori breath test  Result Value Ref Range   H pylori Breath Test  Negative Negative  POCT urinalysis dipstick  Result Value Ref Range   Color, UA yellow yellow   Clarity, UA clear clear   Glucose, UA negative negative mg/dL   Bilirubin, UA negative negative   Ketones, POC UA negative negative mg/dL   Spec Grav, UA 0.960 4.540 - 1.025   Blood, UA negative negative   pH, UA 7.0 5.0 - 8.0   Protein Ur, POC negative negative mg/dL   Urobilinogen, UA 0.2 0.2 or 1.0 E.U./dL   Nitrite, UA Negative Negative   Leukocytes, UA Negative Negative  POCT Microscopic Urinalysis ([facility])  Result Value Ref Range   WBC,UR,HPF,POC None None WBC/hpf   RBC,UR,HPF,POC None None RBC/hpf   Bacteria None None, Too numerous to count   Mucus Absent Absent   Epithelial Cells, UR Per Microscopy None None, Too numerous to count cells/hpf  POCT CBC  Result Value Ref Range   WBC 6.6 4.6 - 10.2 K/uL   Lymph, poc 2.0 0.6 - 3.4   POC LYMPH PERCENT 29.8 10 - 50 %L   MID (cbc) 0.2 0 - 0.9   POC MID % 2.8 0 - 12 %M   POC Granulocyte 4.4 2 - 6.9   Granulocyte percent 67.4 37 - 80 %G   RBC 5.17 4.69 - 6.13 M/uL   Hemoglobin 15.6 14.1 - 18.1 g/dL   HCT, POC 16.1 09.6 - 53.7 %   MCV 87.5 80 - 97 fL   MCH, POC 30.1 27 - 31.2 pg   MCHC 34.4 31.8 - 35.4 g/dL   RDW, POC 04.5 %   Platelet Count, POC 265 142 - 424 K/uL   MPV 7.2 0 - 99.8 fL  POC Hemoccult Bld/Stl (1-Cd Office Dx)  Result Value Ref Range   Card #1 Date 01/31/17    Fecal Occult Blood, POC Negative Negative   Dg Abd Acute W/chest  Result Date: 01/31/2017 CLINICAL DATA:  Five days of waxing and waning LEFT side abdominal pain, lower greater than upper, temporarily improved with bowel movements EXAM: DG ABDOMEN ACUTE W/ 1V CHEST COMPARISON:  None FINDINGS: Normal heart size, mediastinal contours, and pulmonary vascularity. Lungs clear. No pleural effusion or pneumothorax. Normal bowel gas pattern. No bowel dilatation, bowel wall thickening or free air. Osseous structures unremarkable. No urinary tract calcification. Small  RIGHT pelvic phleboliths noted. IMPRESSION: Normal exam. Electronically Signed   By: Ulyses Southward M.D.   On: 01/31/2017 10:58       Assessment & Plan:   1. Abdominal pain, left lower quadrant - unknown etiology - pt with lifelong hx of recurrent abd sxs, but this is distinctly worse - localized, constant. Will refer to GI and obtain US. ADDENDUM: pt called in later same day stating that 5d of pain has sig worsened - now constant stabbing - will proceed with stat abd/pelvic CT to r/o diverticultis or other acute on chronic colitis.. Initial GI appt >1 mo.  2. Left flank pain   3. Abdominal pain, left upper quadrant     Orders Placed This Encounter  Procedures  . DG Abd Acute W/Chest    Standing Status:   Future    Number of Occurrences:   1    Standing Expiration Date:   01/31/2018    Order Specific Question:   Reason for exam:    Answer:   5d of constant wax/waning left-sided abd pain - lower>upper. temporarily improved with BM - BM at baseline - loose sev x/d. nml cbc, neg IFOBT    Order Specific Question:   Preferred imaging location?    Answer:   External  . US Abdomen Complete    Standing Status:   Future    Standing Expiration Date:   04/02/2018    Order Specific Question:   Reason for Exam (SYMPTOM  OR DIAGNOSIS REQUIRED)    Answer:   lifelong h/o recurrent LLQ abdominal pain, bloating, flatulence. Acute left flank pain, normal labs    Order Specific Question:   Preferred imaging location?    Answer:   [facility]  . Comprehensive  metabolic panel  . H. pylori breath test  . Ambulatory referral to Gastroenterology    Referral Priority:   Routine    Referral Type:   Consultation    Referral Reason:   Specialty Services Required    Referred to Provider:   Sherrilyn Rist, MD    Number of Visits Requested:   1  . POCT urinalysis dipstick  . POCT Microscopic Urinalysis ([facility])  . POCT CBC  . POC Hemoccult Bld/Stl (1-Cd Office Dx)    Meds ordered this encounter    Medications  . CONCERTA 27 MG CR tablet    Sig: Take 1 tablet (27 mg total) by mouth every morning.    Dispense:  30 tablet    Refill:  0    May fill 52 days from date written Brand name only medically necessary  . CONCERTA 27 MG CR tablet    Sig: Take 1 tablet (27 mg total) by mouth every morning.    Dispense:  30 tablet    Refill:  0    May fill 25 days from date written Brand name only medically necessary  . CONCERTA 27 MG CR tablet    Sig: Take 1 tablet (27 mg total) by mouth every morning.    Dispense:  30 tablet    Refill:  0    May fill 80 days from date written Brand name only medically necessary  . CONCERTA 27 MG CR tablet    Sig: Take 1 tablet (27 mg total) by mouth every morning.    Dispense:  30 tablet    Refill:  0    Brand name only medically necessary  . methylphenidate (RITALIN) 10 MG tablet    Sig: Take 1 tablet at noon    Dispense:  30 tablet    Refill:  0  . methylphenidate (RITALIN) 10 MG tablet    Sig: Take at noon as needed for ADD    Dispense:  30 tablet    Refill:  0    May fill in 56 days after date written  . methylphenidate (RITALIN) 10 MG tablet    Sig: Take 1 tab at noon for ADD    Dispense:  30 tablet    Refill:  0    May fill in 80days from date wirten  . methylphenidate (RITALIN) 10 MG tablet    Sig: Take at noon as needed for ADD    Dispense:  30 tablet    Refill:  0    May fill 25 days after date written  . Fiber POWD    Sig: Start 1 dose a day    Refill:  0    I personally performed the services described in this documentation, which was scribed in my presence. The recorded information has been reviewed and considered, and addended by me as needed.   Nathan Sorenson, M.D.  Primary Care at [facility] [address] [place], [place] [address] [phone] phone [phone] fax  02/02/17 12:29 PM

## 2017-01-31 NOTE — Patient Instructions (Addendum)
Start a daily fiber supplement like metamucil or benefiber. Initially it may cause more bloating and gas in which case you can cut the dose down to 1/4 or 1/2 dose dialy until your body adjusts, then increase back to the full dose daily.  This will be the first thing GI will tell you to do, so go ahead and get it done.   IF you received an x-ray today, you will receive an invoice from [facility]. Please contact [facility] at [phone] with questions or concerns regarding your invoice.   IF you received labwork today, you will receive an invoice from [facility]. Please contact [facility] at [phone] with questions or concerns regarding your invoice.   Our billing staff will not be able to assist you with questions regarding bills from these companies.  You will be contacted with the lab results as soon as they are available. The fastest way to get your results is to activate your My Chart account. Instructions are located on the last page of this paperwork. If you have not heard from us regarding the results in 2 weeks, please contact this office.      Abdominal Pain, Adult Abdominal pain can be caused by many things. Often, abdominal pain is not serious and it gets better with no treatment or by being treated at home. However, sometimes abdominal pain is serious. Your health care provider will do a medical history and a physical exam to try to determine the cause of your abdominal pain. Follow these instructions at home:  Take over-the-counter and prescription medicines only as told by your health care provider. Do not take a laxative unless told by your health care provider.  Drink enough fluid to keep your urine clear or pale yellow.  Watch your condition for any changes.  Keep all follow-up visits as told by your health care provider. This is important. Contact a health care provider if:  Your abdominal pain changes or gets worse.  You are not hungry or you lose  weight without trying.  You are constipated or have diarrhea for more than 2-3 days.  You have pain when you urinate or have a bowel movement.  Your abdominal pain wakes you up at night.  Your pain gets worse with meals, after eating, or with certain foods.  You are throwing up and cannot keep anything down.  You have a fever. Get help right away if:  Your pain does not go away as soon as your health care provider told you to expect.  You cannot stop throwing up.  Your pain is only in areas of the abdomen, such as the right side or the left lower portion of the abdomen.  You have bloody or black stools, or stools that look like tar.  You have severe pain, cramping, or bloating in your abdomen.  You have signs of dehydration, such as: ? Dark urine, very little urine, or no urine. ? Cracked lips. ? Dry mouth. ? Sunken eyes. ? Sleepiness. ? Weakness. This information is not intended to replace advice given to you by your health care provider. Make sure you discuss any questions you have with your health care provider. Document Released: 02/22/2005 Document Revised: 12/03/2015 Document Reviewed: 10/27/2015 Elsevier Interactive Patient Education  2017 [employer]

## 2017-02-01 ENCOUNTER — Telehealth: Payer: Self-pay | Admitting: Family Medicine

## 2017-02-01 ENCOUNTER — Ambulatory Visit: Payer: BLUE CROSS/BLUE SHIELD | Admitting: Family Medicine

## 2017-02-01 LAB — H. PYLORI BREATH TEST: H PYLORI BREATH TEST: NEGATIVE

## 2017-02-01 NOTE — Telephone Encounter (Signed)
Call to patient he states that yesterday the pain became stabbing instead of dull. He said today it is not as bad as yesterday.  I asked him which side it was on and he said lower left side. I asked him if they took xrays during his visit here and he said yes.  I asked if he would like to come in to see Dr. Clelia CroftShaw again.  He said if he had to go somewhere he would probably go to ED.  I told him that the ED would have a CT scan that would be able to see things that xray could not.  He said that he thought he would wait until tomorrow to see if the pain lost intensity.  I advised patient that he should RTC or ED if pain persists.  Is there anything else you would like the patient to know.  If so, please advise.   Thank you!

## 2017-02-01 NOTE — Telephone Encounter (Signed)
PATIENT STATES HE WAS SEEN BY DR. SHAW YESTERDAY (01/31/17) FOR ABDOMINAL PAIN. HE SAID THE PAIN HAS GONE FROM BEING DULL INTO STABBING THAT STARTED YESTERDAY AFTERNOON AFTER HE LEFT HERE. EVERY TIME HE TAKES A STEP ON HIS (L) SIDE. HIS APPOINTMENT WITH THE GI SPECIALIST IS NOT UNTIL October. HE DOES NOT KNOW WHAT TO DO? BEST PHONE [phone] (CELL) MBC

## 2017-02-02 NOTE — Telephone Encounter (Signed)
Called patient to advise of Dr. Alver FisherShaw's message.  He said that this morning and today he has had nausea which is a new symptom for him.  He will likely go to ED since a CT has been ordered.  He thinks he also would like to go to the GI doctor because he is concerned that the pain my worsen, but it never completely dissipates. I advised patient that I would let Dr. Clelia CroftShaw know that he does want to go to GI and made sure he understands it could be a week or more before they see him.  He is fine with that.

## 2017-02-02 NOTE — Telephone Encounter (Signed)
Perfect advise Renay - thank you.  Workup in the office was normal so I agree that since his pain is significantly worse, we should proceed with a CT scan and I could personally call the GI doctors to get him seen sooner. However even an urgent GI work in would probably be at minimum a couple days and possibly a week.  I am concerned that because his labs are normal, the ER would just CT scan on him and send him home to keep his outpatient GI follow-up as long as his CT was okay. I doubt they would actually call GI to see him in the ER due to normal labs. However, it is always most important that he trust his gut (haha) and if it is making him think emergency, then he should go there.  I am in the office on Sat if needed.  Will order CT scan - even if his pain is not worse, it is still there and we don't know why - US and GI will take a while to get so best to just proceed with stat CT now.

## 2017-02-02 NOTE — Telephone Encounter (Signed)
Great - can we please try to get stat abd/pelvic CT done today? Has been ordered. With contrast. Perhaps [facility]? Thanks.

## 2017-02-02 NOTE — Telephone Encounter (Signed)
CT has been authorized. I called [facility] and they were able to put pt on schedule for 4:30pm however he would need to pick up contrast and start drinking at 2:30pm. I was unable to get the pt on the phone so I called [facility] and asked if they had anything later today so pt would have time to drink and pick up contrast. They said they are 24/7 so they can see him later whenever he can come in. I tried calling the pt a second time and have left a message to call back and ask for me. Thanks

## 2017-02-07 NOTE — Telephone Encounter (Signed)
Please call pt and see if he would still like to proceed with the CT scan.

## 2017-02-08 NOTE — Telephone Encounter (Signed)
LMVM advising patient that a CT scan has been scheduled, however, we were unable to contact him by telephone.  If he is still interested he can just go to [facility] to have scan completed.  If he is not wanting the scan any longer, please call me back (left my direct line for patient.)

## 2017-03-22 ENCOUNTER — Encounter: Payer: Self-pay | Admitting: Family Medicine

## 2017-03-23 ENCOUNTER — Ambulatory Visit: Payer: Self-pay | Admitting: Gastroenterology

## 2017-04-30 ENCOUNTER — Encounter: Payer: Self-pay | Admitting: Family Medicine

## 2017-04-30 ENCOUNTER — Telehealth: Payer: Self-pay | Admitting: Family Medicine

## 2017-04-30 MED ORDER — CONCERTA 27 MG PO TBCR: 27.0000 mg | EXTENDED_RELEASE_TABLET | ORAL | 0 refills | Status: DC

## 2017-04-30 NOTE — Telephone Encounter (Signed)
rx printed and at 102 for pt pick-up. Sent pt MyChart email to alert.

## 2017-04-30 NOTE — Telephone Encounter (Signed)
Copied from CRM [phone]. Topic: General - Other >> Apr 30, 2017 11:40 AM Cecelia ByarsGreen, Temeka L, RMA wrote: Reason for CRM: patient is requesting a refill for Concerta 27 mg, prescription was printed for 04/21/2017 pt states he lost prescription and he only has one pill left for today. Please call pt at [phone]

## 2017-07-03 ENCOUNTER — Telehealth: Payer: Self-pay | Admitting: Family Medicine

## 2017-07-03 NOTE — Telephone Encounter (Signed)
Copied from CRM [phone]. Topic: Quick Communication - Rx Refill/Question >> Jul 03, 2017  8:33 AM Louie BunPalacios Medina, Rosey Batheresa D wrote: Medication: CONCERTA 27 MG CR tablet and methylphenidate (RITALIN) 10 MG tablet Has the patient contacted their pharmacy? Yes but medication need prior authorization. (Agent: If no, request that the patient contact the pharmacy for the refill.) Preferred Pharmacy (with phone number or street name): [facility] [phone] - [place], [place] - [address] Agent: Please be advised that RX refills may take up to 3 business days. We ask that you follow-up with your pharmacy.

## 2017-07-03 NOTE — Telephone Encounter (Signed)
Patient LMVM advising me that both of his medications require PA.  I will start on them in the morning.

## 2017-07-03 NOTE — Telephone Encounter (Signed)
concerta refill Last OV: 01/31/17 Last Refill:? Pharmacy[facility] [address] [place]  methylphenidate refill Last OV: 01/31/17 Last Refill:? Pharmacy:[facility] [address] [place]

## 2017-07-03 NOTE — Telephone Encounter (Signed)
Left detailed message on VM for patient advising him to call my direct line so that I can ask him some questions re: his medication.

## 2017-07-04 NOTE — Telephone Encounter (Signed)
PA is approved for both meds from today until 07/04/2018.  Patient notified.

## 2017-07-21 ENCOUNTER — Ambulatory Visit: Payer: 59 | Admitting: Family Medicine

## 2017-07-21 ENCOUNTER — Other Ambulatory Visit: Payer: Self-pay

## 2017-07-21 ENCOUNTER — Encounter: Payer: Self-pay | Admitting: Family Medicine

## 2017-07-21 VITALS — BP 122/88 | HR 83 | Temp 98.2°F | Resp 18 | Ht 71.0 in | Wt 238.6 lb

## 2017-07-21 DIAGNOSIS — R109 Unspecified abdominal pain: Secondary | ICD-10-CM | POA: Diagnosis not present

## 2017-07-21 DIAGNOSIS — F411 Generalized anxiety disorder: Secondary | ICD-10-CM

## 2017-07-21 DIAGNOSIS — Z79899 Other long term (current) drug therapy: Secondary | ICD-10-CM

## 2017-07-21 DIAGNOSIS — F330 Major depressive disorder, recurrent, mild: Secondary | ICD-10-CM | POA: Diagnosis not present

## 2017-07-21 DIAGNOSIS — G8929 Other chronic pain: Secondary | ICD-10-CM | POA: Diagnosis not present

## 2017-07-21 DIAGNOSIS — F988 Other specified behavioral and emotional disorders with onset usually occurring in childhood and adolescence: Secondary | ICD-10-CM

## 2017-07-21 DIAGNOSIS — Z23 Encounter for immunization: Secondary | ICD-10-CM

## 2017-07-21 DIAGNOSIS — K599 Functional intestinal disorder, unspecified: Secondary | ICD-10-CM

## 2017-07-21 MED ORDER — PROPRANOLOL HCL 40 MG PO TABS: 40.0000 mg | ORAL_TABLET | Freq: Three times a day (TID) | ORAL | 2 refills | Status: DC | PRN

## 2017-07-21 MED ORDER — VENLAFAXINE HCL ER 37.5 MG PO CP24: 37.5000 mg | ORAL_CAPSULE | Freq: Every day | ORAL | 2 refills | Status: DC

## 2017-07-21 MED ORDER — VALACYCLOVIR HCL 1 G PO TABS: 1000.0000 mg | ORAL_TABLET | Freq: Every day | ORAL | 11 refills | Status: AC

## 2017-07-21 MED ORDER — LISDEXAMFETAMINE DIMESYLATE 30 MG PO CAPS: 30.0000 mg | ORAL_CAPSULE | Freq: Every day | ORAL | 0 refills | Status: DC

## 2017-07-21 NOTE — Patient Instructions (Addendum)
   IF you received an x-ray today, you will receive an invoice from [facility]. Please contact [facility] at [phone] with questions or concerns regarding your invoice.   IF you received labwork today, you will receive an invoice from [facility]. Please contact [facility] at [phone] with questions or concerns regarding your invoice.   Our billing staff will not be able to assist you with questions regarding bills from these companies.  You will be contacted with the lab results as soon as they are available. The fastest way to get your results is to activate your My Chart account. Instructions are located on the last page of this paperwork. If you have not heard from us regarding the results in 2 weeks, please contact this office.     Living With Anxiety After being diagnosed with an anxiety disorder, you may be relieved to know why you have felt or behaved a certain way. It is natural to also feel overwhelmed about the treatment ahead and what it will mean for your life. With care and support, you can manage this condition and recover from it. How to cope with anxiety Dealing with stress Stress is your body's reaction to life changes and events, both good and bad. Stress can last just a few hours or it can be ongoing. Stress can play a major role in anxiety, so it is important to learn both how to cope with stress and how to think about it differently. Talk with your health care provider or a counselor to learn more about stress reduction. He or she may suggest some stress reduction techniques, such as:  Music therapy. This can include creating or listening to music that you enjoy and that inspires you.  Mindfulness-based meditation. This involves being aware of your normal breaths, rather than trying to control your breathing. It can be done while sitting or walking.  Centering prayer. This is a kind of meditation that involves focusing on a word, phrase, or  sacred image that is meaningful to you and that brings you peace.  Deep breathing. To do this, expand your stomach and inhale slowly through your nose. Hold your breath for 3-5 seconds. Then exhale slowly, allowing your stomach muscles to relax.  Self-talk. This is a skill where you identify thought patterns that lead to anxiety reactions and correct those thoughts.  Muscle relaxation. This involves tensing muscles then relaxing them.  Choose a stress reduction technique that fits your lifestyle and personality. Stress reduction techniques take time and practice. Set aside 5-15 minutes a day to do them. Therapists can offer training in these techniques. The training may be covered by some insurance plans. Other things you can do to manage stress include:  Keeping a stress diary. This can help you learn what triggers your stress and ways to control your response.  Thinking about how you respond to certain situations. You may not be able to control everything, but you can control your reaction.  Making time for activities that help you relax, and not feeling guilty about spending your time in this way.  Therapy combined with coping and stress-reduction skills provides the best chance for successful treatment. Medicines Medicines can help ease symptoms. Medicines for anxiety include:  Anti-anxiety drugs.  Antidepressants.  Beta-blockers.  Medicines may be used as the main treatment for anxiety disorder, along with therapy, or if other treatments are not working. Medicines should be prescribed by a health care provider. Relationships Relationships can play a big part in   helping you recover. Try to spend more time connecting with trusted friends and family members. Consider going to couples counseling, taking family education classes, or going to family therapy. Therapy can help you and others better understand the condition. How to recognize changes in your condition Everyone has a  different response to treatment for anxiety. Recovery from anxiety happens when symptoms decrease and stop interfering with your daily activities at home or work. This may mean that you will start to:  Have better concentration and focus.  Sleep better.  Be less irritable.  Have more energy.  Have improved memory.  It is important to recognize when your condition is getting worse. Contact your health care provider if your symptoms interfere with home or work and you do not feel like your condition is improving. Where to find help and support: You can get help and support from these sources:  Self-help groups.  Online and community organizations.  A trusted spiritual leader.  Couples counseling.  Family education classes.  Family therapy.  Follow these instructions at home:  Eat a healthy diet that includes plenty of vegetables, fruits, whole grains, low-fat dairy products, and lean protein. Do not eat a lot of foods that are high in solid fats, added sugars, or salt.  Exercise. Most adults should do the following: ? Exercise for at least 150 minutes each week. The exercise should increase your heart rate and make you sweat (moderate-intensity exercise). ? Strengthening exercises at least twice a week.  Cut down on caffeine, tobacco, alcohol, and other potentially harmful substances.  Get the right amount and quality of sleep. Most adults need 7-9 hours of sleep each night.  Make choices that simplify your life.  Take over-the-counter and prescription medicines only as told by your health care provider.  Avoid caffeine, alcohol, and certain over-the-counter cold medicines. These may make you feel worse. Ask your pharmacist which medicines to avoid.  Keep all follow-up visits as told by your health care provider. This is important. Questions to ask your health care provider  Would I benefit from therapy?  How often should I follow up with a health care  provider?  How long do I need to take medicine?  Are there any long-term side effects of my medicine?  Are there any alternatives to taking medicine? Contact a health care provider if:  You have a hard time staying focused or finishing daily tasks.  You spend many hours a day feeling worried about everyday life.  You become exhausted by worry.  You start to have headaches, feel tense, or have nausea.  You urinate more than normal.  You have diarrhea. Get help right away if:  You have a racing heart and shortness of breath.  You have thoughts of hurting yourself or others. If you ever feel like you may hurt yourself or others, or have thoughts about taking your own life, get help right away. You can go to your nearest emergency department or call:  Your local emergency services (911 in the [place]  A suicide crisis helpline, such as the National Suicide Prevention Lifeline at 1-800-273-8255. This is open 24-hours a day.  Summary  Taking steps to deal with stress can help calm you.  Medicines cannot cure anxiety disorders, but they can help ease symptoms.  Family, friends, and partners can play a big part in helping you recover from an anxiety disorder. This information is not intended to replace advice given to you by your health care provider. Make   sure you discuss any questions you have with your health care provider. Document Released: 05/09/2016 Document Revised: 05/09/2016 Document Reviewed: 05/09/2016 Elsevier Interactive Patient Education  2018 Elsevier Inc.  

## 2017-07-21 NOTE — Progress Notes (Addendum)
By signing my name below, I, [name], attest that this documentation has been prepared under the direction and in the presence of [name] July, MD. Electronically Signed: [name], Medical Scribe 07/21/2017 at 10:23 AM. Subjective:    Patient ID: Nathan [name], male    DOB: 1985/10/28, 32 y.o.   MRN: [id number]  HPI Chief Complaint  Patient presents with  . ADHD    x6 months, pt states he noticed that he is loosing focus often. Pt states it has gotten progressively worse. Pt states he has been experiencing panic attacks. Pt states he would like a referral to a therapist to assist with the panic attacks and ADHD. Pt states he just doesn't want to increase meds.  . Medication Refill    Valtrex   Nathan [name] is a 32 y.o. male who presents to Primary Care at [facility] complaining of ADHD symptoms and anxiety.   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 His wife [name] also delivered their first child - baby girl Blake approx. 6 months ago.   He reports doing a lot of office work and has been having increased panic attacks (recently, they are occurring daily), which is debilitating to him. He has palpitations, sweating, and it is impacting his daily life when he has panic attacks. He feels anxious due to work because he is typing a lot and doing paperwork in the office, and not focusing too much on client interaction, which is what he enjoys. He is not sleeping well at night due to being anxious and working a lot. When he has difficulty sleeping, he states his brain is going and going, as well as he has a lot of work to do. He has been on the same dose for years. He does not want to increase his ADD med dose, but feels his current dose is not working as well it has been. He has a FMHx of addiction (of food and alcohol). He wants to speak to a therapist regarding his symptoms instead of changing his medication dose. He never tried zoloft he was rx'd 18 mos prior  (chosen due to social anxiety and OCD sxs/concerns he was having then). He does think that maybe he is having some depression in addition to the anxiety and worsening focus problems but not sure if this is purely because of the other problems or not - likely is related to being so focused on his career. He has been very busy and has not been able to enjoy some of the things he used to and would like to try meds at this time.   He did try adderall 10mg  bid 5 yrs prior when we initially started him on stimulant therapy and immediately noted some adverse side effects like profuse sweating and abdominal pain so stopped within just a few days and has been on Concerta ever since.  Has been on Concerta 27mg  qd ever since 09/2014 when we transitioned him from IR Ritalin 10mg  bid which he had been on for ~2 yrs after failing adderall for sev d.  He has tried propranolol before for panic attacks previously which helped some of his symptoms.  My chart notes state that several years ago, he was taking propanolol 40mg  at lunch and this helped prevent his anxiety/panic from reving up as he tried to get the remainder of his day done though pt does not remember the propranolol nor its effectiveness or lack thereof.  He also is  requesting a refill for valtrex and another referral to GI due to a lapse in his insurance.   Past Medical History:  Diagnosis Date  . Anxiety    Past Surgical History:  Procedure Laterality Date  . WISDOM TOOTH EXTRACTION     Current Outpatient Medications on File Prior to Visit  Medication Sig Dispense Refill  . CONCERTA 27 MG CR tablet Take 1 tablet (27 mg total) by mouth every morning. 30 tablet 0  . CONCERTA 27 MG CR tablet Take 1 tablet (27 mg total) by mouth every morning. 30 tablet 0  . CONCERTA 27 MG CR tablet Take 1 tablet (27 mg total) by mouth every morning. 30 tablet 0  . Fiber POWD Start 1 dose a day  0  . methylphenidate (RITALIN) 10 MG tablet Take 1 tablet at noon 30  tablet 0  . methylphenidate (RITALIN) 10 MG tablet Take at noon as needed for ADD 30 tablet 0  . methylphenidate (RITALIN) 10 MG tablet Take 1 tab at noon for ADD 30 tablet 0  . methylphenidate (RITALIN) 10 MG tablet Take at noon as needed for ADD 30 tablet 0  . sildenafil (REVATIO) 20 MG tablet Take as directed by physician 30 tablet 2   No current facility-administered medications on file prior to visit.    No Known Allergies Family History  Problem Relation Age of Onset  . Cancer Father    Social History   Socioeconomic History  . Marital status: Married    Spouse name: Not on file  . Number of children: Not on file  . Years of education: Not on file  . Highest education level: Not on file  Social Needs  . Financial resource strain: Not on file  . Food insecurity - worry: Not on file  . Food insecurity - inability: Not on file  . Transportation needs - medical: Not on file  . Transportation needs - non-medical: Not on file  Occupational History  . Not on file  Tobacco Use  . Smoking status: Never Smoker  . Smokeless tobacco: Never Used  Substance and Sexual Activity  . Alcohol use: No  . Drug use: Not on file  . Sexual activity: Yes  Other Topics Concern  . Not on file  Social History Narrative  . Not on file   Depression screen [facility] 2/9 07/21/2017 01/31/2017 09/28/2016 01/13/2016 07/01/2015  Decreased Interest 0 0 0 0 0  Down, Depressed, Hopeless 0 0 0 0 0  PHQ - 2 Score 0 0 0 0 0    Review of Systems  Constitutional: Negative for fever.  Psychiatric/Behavioral: Positive for decreased concentration and sleep disturbance. The patient is nervous/anxious. The patient is not hyperactive.        Objective:   Physical Exam  Constitutional: He is oriented to person, place, and time. He appears well-developed and well-nourished. No distress.  HENT:  Head: Normocephalic and atraumatic.  Eyes: Conjunctivae are normal. Pupils are equal, round, and reactive to light. No  scleral icterus.  Neck: Normal range of motion. Neck supple. No thyromegaly present.  Cardiovascular: Normal rate, regular rhythm, normal heart sounds and intact distal pulses.  Pulmonary/Chest: Effort normal and breath sounds normal. No respiratory distress.  Musculoskeletal: He exhibits no edema.  Lymphadenopathy:    He has no cervical adenopathy.  Neurological: He is alert and oriented to person, place, and time.  Skin: Skin is warm and dry. He is not diaphoretic.  Psychiatric: He has a normal mood and  affect. His behavior is normal.   BP 122/88 (BP Location: Left Arm, Patient Position: Sitting, Cuff Size: Normal)   Pulse 83   Temp 98.2 F (36.8 C) (Oral)   Resp 18   Ht 5\' 11"  (1.803 m)   Wt 238 lb 9.6 oz (108.2 kg)   SpO2 98%   BMI 33.28 kg/m      Assessment & Plan:   1. Attention deficit disorder (ADD) without hyperactivity - stable on cancerta 27mg  for years with rare ritalin IR 10mg  prn for augmentation around afternoon. Pt having more problems but reluctant to increase med due to strong FHx of food, EtOH, med addictions he recently learned about.  Requests info on psychologists/therapists to help him learn how to cope/adapt to his challenges - rec therapists sent in MyChart email to pt. Pt has really only ever been on methylphenidate products and has not had ANY dose escalation since starting. He has taken freq drug holidays so never ANY signs that would raise a possible flag for dependence/misuse/abuse. Pt agrees that the several day trial of the amphetamine salts may not have been sufficient to r/o that whole classes effect so agrees to try to transition to another product - likely he has some tolerance so this will get around that issue w/o dose escalation.  Trial of low dose vyvanse - can increase if needed but don't want to start at any higher dose due to prev adderall experience.  2. High risk medication use   3. Anxiety state - restart propranolol - can take prn panic  attack but also try to pretreat for situations he knows often induce panic - consider taking one daily at lunch again as worked well prior.  Call if needs to try something for sleep - consider hydroxyzine, doxepin.  4. Mild episode of recurrent major depressive disorder (HCC) - was on wellbutrin prior to starting stimulant therapy but had minimal effect on that, no other med trial - never started the rx'd zoloft.  Considered ssri or buspar but elected to start trial of low dose effexor now as hoping the snri will benefit his concentration/focus issues as well which are likely the trigger of his other mood sxs.  5. Flu vaccine need   6. Chronic abdominal pain - years of chronic problems - pt knows he needs to see GI - requests renewal of prior GI referral for further eval - referred to Dr. Myrtie Neither at [facility].  7. Bowel dysfunction     Orders Placed This Encounter  Procedures  . Flu Vaccine QUAD 36+ mos IM  . Ambulatory referral to Gastroenterology    Referral Priority:   Routine    Referral Type:   Consultation    Referral Reason:   Specialty Services Required    Referred to Provider:   Sherrilyn Rist, MD    Number of Visits Requested:   1    Meds ordered this encounter  Medications  . lisdexamfetamine (VYVANSE) 30 MG capsule    Sig: Take 1 capsule (30 mg total) by mouth daily.    Dispense:  30 capsule    Refill:  0  . valACYclovir (VALTREX) 1000 MG tablet    Sig: Take 1 tablet (1,000 mg total) by mouth daily.    Dispense:  30 tablet    Refill:  11  . venlafaxine XR (EFFEXOR-XR) 37.5 MG 24 hr capsule    Sig: Take 1 capsule (37.5 mg total) by mouth daily with breakfast.    Dispense:  30 capsule    Refill:  2  . propranolol (INDERAL) 40 MG tablet    Sig: Take 1 tablet (40 mg total) by mouth 3 (three) times daily as needed (anxiety/panic).    Dispense:  30 tablet    Refill:  2   Last filled Concerta on 7/30, 9/5, 10/4, 11/3, 12/4, 1/2, 2/6.  Last filled methylphenidate IR 10  8/31, 10/4, 2/4. Today I have utilized the [place] Controlled Substance Registry's online query to confirm compliance regarding the patient's controlled medications. My review reveals appropriate prescription fills and that I am the sole provider of these medications. Rechecks will occur regularly and the patient is aware of our use of the system.  I personally performed the services described in this documentation, which was scribed in my presence. The recorded information has been reviewed and considered, and addended by me as needed.   Norberto Sorenson, M.D.  Primary Care at [facility] [address] [place], [place] [address] [phone] phone [phone] fax  07/22/17 3:22 PM

## 2017-07-22 ENCOUNTER — Encounter: Payer: Self-pay | Admitting: Family Medicine

## 2017-07-23 ENCOUNTER — Telehealth: Payer: Self-pay | Admitting: Family Medicine

## 2017-07-23 NOTE — Telephone Encounter (Signed)
Called pt to reschedule his appt he has with Dr. Clelia CroftShaw on 08/18/17. When pt calls back, please reschedule him at his convenience with Dr. Clelia CroftShaw. For a 4 week F/U.  Thanks!

## 2017-08-18 ENCOUNTER — Ambulatory Visit: Payer: 59 | Admitting: Family Medicine

## 2017-08-20 ENCOUNTER — Telehealth: Payer: Self-pay | Admitting: Family Medicine

## 2017-08-20 ENCOUNTER — Other Ambulatory Visit: Payer: Self-pay | Admitting: Family Medicine

## 2017-08-20 NOTE — Telephone Encounter (Signed)
Copied from CRM [id number]. Topic: Quick Communication - Rx Refill/Question >> Aug 20, 2017 12:57 PM Raquel SarnaHayes, Teresa G wrote: lisdexamfetamine (VYVANSE) 30 MG capsule  [id number], [place] - [address] [place] [id number] Phone: [phone] Fax: [phone] Not a 24 hour pharmacy; exact hours not known

## 2017-08-20 NOTE — Telephone Encounter (Signed)
Rx  Request  Vyvanse   30  Mg  Caps   LOV  07/06/2017  Pharmacy  Requested   [facility]   [address]   [place]   [place]

## 2017-08-20 NOTE — Telephone Encounter (Signed)
Duplicate encounter. See refill encounter dated 08/20/17.

## 2017-08-21 MED ORDER — LISDEXAMFETAMINE DIMESYLATE 30 MG PO CAPS: 30.0000 mg | ORAL_CAPSULE | Freq: Every day | ORAL | 0 refills | Status: DC

## 2017-09-03 ENCOUNTER — Encounter: Payer: Self-pay | Admitting: Family Medicine

## 2017-09-20 ENCOUNTER — Other Ambulatory Visit: Payer: Self-pay

## 2017-09-20 MED ORDER — LISDEXAMFETAMINE DIMESYLATE 30 MG PO CAPS
30.0000 mg | ORAL_CAPSULE | Freq: Every day | ORAL | 0 refills | Status: DC
Start: 2017-09-20 — End: 2017-10-19

## 2017-09-20 NOTE — Telephone Encounter (Signed)
Needs office visit in 1 mo for further refills.

## 2017-09-20 NOTE — Addendum Note (Signed)
Addended by: Sherren MochaSHAW, EVA N on: 09/20/2017 04:21 PM   Modules accepted: Orders

## 2017-09-21 ENCOUNTER — Encounter: Payer: Self-pay | Admitting: Family Medicine

## 2017-10-04 ENCOUNTER — Encounter: Payer: Self-pay | Admitting: Family Medicine

## 2017-10-06 ENCOUNTER — Ambulatory Visit: Payer: BLUE CROSS/BLUE SHIELD | Admitting: Family Medicine

## 2017-10-15 ENCOUNTER — Other Ambulatory Visit: Payer: Self-pay | Admitting: Family Medicine

## 2017-10-15 NOTE — Telephone Encounter (Signed)
Venlafaxine (Effexor XR) refill Last OV: 07/21/17 Last Refill:07/21/17 #30 capsules 2 RF [facility] [address]  Dr Norberto Sorenson

## 2017-10-17 ENCOUNTER — Other Ambulatory Visit: Payer: Self-pay | Admitting: Family Medicine

## 2017-10-17 NOTE — Telephone Encounter (Signed)
mychart message sent to pt about making an appt °

## 2017-10-19 ENCOUNTER — Other Ambulatory Visit: Payer: Self-pay | Admitting: Family Medicine

## 2017-10-19 NOTE — Telephone Encounter (Addendum)
Patient has appointment for Medication on 11/24/17. Medication pended for provider consideration. No-showed last appointment on 10/06/2017. Cancelled appointments for 6/7 and 6/20.

## 2017-10-19 NOTE — Telephone Encounter (Signed)
Copied from CRM [id number]. Topic: Quick Communication - Rx Refill/Question >> Oct 19, 2017  9:56 AM Nathan [name] wrote: Medication: vyvanse 30 mg  Has the patient contacted their pharmacy? no} (Agent: If no, request that the patient contact the pharmacy for the refill.) pt has an appt sch for 11-15-17 (Agent: If yes, when and what did the pharmacy advise?)  Preferred Pharmacy (with phone number or street name[facility] [place] Agent: Please be advised that RX refills may take up to 3 business days. We ask that you follow-up with your pharmacy.

## 2017-10-19 NOTE — Addendum Note (Signed)
Addended by: Oneida Alar on: 10/19/2017 04:21 PM   Modules accepted: Orders

## 2017-10-26 MED ORDER — ALUMINUM CHLORIDE 20 % EX SOLN: Freq: Every day | CUTANEOUS | 0 refills | Status: AC

## 2017-10-26 MED ORDER — LISDEXAMFETAMINE DIMESYLATE 50 MG PO CAPS
50.0000 mg | ORAL_CAPSULE | Freq: Every day | ORAL | 0 refills | Status: DC
Start: 2017-10-26 — End: 2017-11-24

## 2017-10-26 NOTE — Telephone Encounter (Signed)
Refilled at increased dose per Mychart note - reassess effect on 6/20 visit

## 2017-10-26 NOTE — Addendum Note (Signed)
Addended by: Sherren MochaSHAW, EVA N on: 10/26/2017 11:11 AM   Modules accepted: Orders

## 2017-11-02 ENCOUNTER — Ambulatory Visit: Payer: BLUE CROSS/BLUE SHIELD | Admitting: Family Medicine

## 2017-11-15 ENCOUNTER — Ambulatory Visit: Payer: BLUE CROSS/BLUE SHIELD | Admitting: Family Medicine

## 2017-11-15 ENCOUNTER — Encounter

## 2017-11-24 ENCOUNTER — Other Ambulatory Visit: Payer: Self-pay

## 2017-11-24 ENCOUNTER — Ambulatory Visit: Payer: BLUE CROSS/BLUE SHIELD | Admitting: Family Medicine

## 2017-11-24 ENCOUNTER — Encounter: Payer: Self-pay | Admitting: Family Medicine

## 2017-11-24 VITALS — BP 135/82 | HR 78 | Temp 98.1°F | Resp 16 | Ht 71.0 in | Wt 243.6 lb

## 2017-11-24 DIAGNOSIS — F429 Obsessive-compulsive disorder, unspecified: Secondary | ICD-10-CM

## 2017-11-24 DIAGNOSIS — F330 Major depressive disorder, recurrent, mild: Secondary | ICD-10-CM | POA: Diagnosis not present

## 2017-11-24 DIAGNOSIS — F411 Generalized anxiety disorder: Secondary | ICD-10-CM

## 2017-11-24 DIAGNOSIS — R61 Generalized hyperhidrosis: Secondary | ICD-10-CM

## 2017-11-24 DIAGNOSIS — F988 Other specified behavioral and emotional disorders with onset usually occurring in childhood and adolescence: Secondary | ICD-10-CM | POA: Diagnosis not present

## 2017-11-24 DIAGNOSIS — Z79899 Other long term (current) drug therapy: Secondary | ICD-10-CM

## 2017-11-24 MED ORDER — METHYLPHENIDATE HCL ER (OSM) 27 MG PO TBCR: 27.0000 mg | EXTENDED_RELEASE_TABLET | ORAL | 0 refills | Status: DC

## 2017-11-24 MED ORDER — AMPHETAMINE-DEXTROAMPHETAMINE 10 MG PO TABS: 10.0000 mg | ORAL_TABLET | Freq: Two times a day (BID) | ORAL | 0 refills | Status: DC

## 2017-11-24 MED ORDER — GLYCOPYRROLATE 1 MG PO TABS: 1.0000 mg | ORAL_TABLET | Freq: Two times a day (BID) | ORAL | 2 refills | Status: DC

## 2017-11-24 MED ORDER — METHYLPHENIDATE HCL ER (OSM) 36 MG PO TBCR: 36.0000 mg | EXTENDED_RELEASE_TABLET | Freq: Every day | ORAL | 0 refills | Status: DC

## 2017-11-24 MED ORDER — VENLAFAXINE HCL ER 75 MG PO CP24: 75.0000 mg | ORAL_CAPSULE | Freq: Every day | ORAL | 1 refills | Status: DC

## 2017-11-24 MED ORDER — PROPRANOLOL HCL 40 MG PO TABS: 40.0000 mg | ORAL_TABLET | Freq: Three times a day (TID) | ORAL | 2 refills | Status: AC | PRN

## 2017-11-24 NOTE — Progress Notes (Signed)
Subjective:  By signing my name below, I, Essence Howell, attest that this documentation has been prepared under the direction and in the presence of Norberto Sorenson, MD Electronically Signed: Charline Bills, ED Scribe 11/24/2017 at 8:59 AM.   Patient ID: Nathan [name], male    DOB: Jan 13, 1986, 32 y.o.   MRN: [id number]  Chief Complaint  Patient presents with  . ADD    follow up on medications   . Excessive Sweating    going on for years    HPI Nathan [name] is a 32 y.o. male who presents to Primary Care at [facility] for f/u on Vyvanse 50 mg. Pt has noticed a decrease in hyperactivity but states his mind is still racing and he has more cravings. States Concerta seemed to work better. He does report that his ADD seems to be getting worse and he has not been able to make it to a therapist here as it's difficult to travel from [place] to see one.  Excessive Sweating Pt also reports excessive sweating to his axillas and legs for sev yrs. States symptoms have progressively worsened as he now sweats through his suit coats even while sitting in air conditioning. Even reports sweating at night although he sleeps with a fan on. Symptoms are worse around 10 AM and with increased anxiety. Pt states he doesn't necessarily feel feverish, he just sweats a lot. He does take propranolol 40 mg for long court days which he states seems to calm him.  Wife [name], daughter Harrison Mons. Past Medical History:  Diagnosis Date  . Anxiety    Past Surgical History:  Procedure Laterality Date  . WISDOM TOOTH EXTRACTION     Current Outpatient Medications on File Prior to Visit  Medication Sig Dispense Refill  . aluminum chloride (DRYSOL) 20 % external solution Apply topically at bedtime. as directed by physician 60 mL 0  . Fiber POWD Start 1 dose a day  0  . lisdexamfetamine (VYVANSE) 50 MG capsule Take 1 capsule (50 mg total) by mouth daily. **Needs office visit for further refills** 30 capsule 0  . propranolol (INDERAL) 40 MG  tablet Take 1 tablet (40 mg total) by mouth 3 (three) times daily as needed (anxiety/panic). 30 tablet 2  . sildenafil (REVATIO) 20 MG tablet Take as directed by physician 30 tablet 2  . valACYclovir (VALTREX) 1000 MG tablet Take 1 tablet (1,000 mg total) by mouth daily. 30 tablet 11  . venlafaxine XR (EFFEXOR-XR) 37.5 MG 24 hr capsule TAKE 1 CAPSULE (37.5 MG TOTAL) BY MOUTH DAILY WITH BREAKFAST. 30 capsule 2   No current facility-administered medications on file prior to visit.    No Known Allergies Family History  Problem Relation Age of Onset  . Cancer Father    Social History   Socioeconomic History  . Marital status: Married    Spouse name: Not on file  . Number of children: Not on file  . Years of education: Not on file  . Highest education level: Not on file  Occupational History  . Not on file  Social Needs  . Financial resource strain: Not on file  . Food insecurity:    Worry: Not on file    Inability: Not on file  . Transportation needs:    Medical: Not on file    Non-medical: Not on file  Tobacco Use  . Smoking status: Never Smoker  . Smokeless tobacco: Never Used  Substance and Sexual Activity  . Alcohol use: No  . Drug use:  Not on file  . Sexual activity: Yes  Lifestyle  . Physical activity:    Days per week: Not on file    Minutes per session: Not on file  . Stress: Not on file  Relationships  . Social connections:    Talks on phone: Not on file    Gets together: Not on file    Attends religious service: Not on file    Active member of club or organization: Not on file    Attends meetings of clubs or organizations: Not on file    Relationship status: Not on file  Other Topics Concern  . Not on file  Social History Narrative  . Not on file   Depression screen [facility] 2/9 11/24/2017 07/21/2017 01/31/2017 09/28/2016 01/13/2016  Decreased Interest 0 0 0 0 0  Down, Depressed, Hopeless 0 0 0 0 0  PHQ - 2 Score 0 0 0 0 0   Review of Systems  Constitutional:  Positive for diaphoresis. Negative for fever.      Objective:   Physical Exam  Constitutional: He is oriented to person, place, and time. He appears well-developed and well-nourished. No distress.  HENT:  Head: Normocephalic and atraumatic.  Eyes: Conjunctivae and EOM are normal.  Neck: Neck supple. No tracheal deviation present.  Cardiovascular: Normal rate.  Pulmonary/Chest: Effort normal. No respiratory distress.  Musculoskeletal: Normal range of motion.  Neurological: He is alert and oriented to person, place, and time.  Skin: Skin is warm and dry.  Psychiatric: He has a normal mood and affect. His behavior is normal.  Nursing note and vitals reviewed.   BP 135/82   Pulse 78   Temp 98.1 F (36.7 C)   Resp 16   Ht 5\' 11"  (1.803 m)   Wt 243 lb 9.6 oz (110.5 kg)   SpO2 97%   BMI 33.98 kg/m     Assessment & Plan:    1. Attention deficit disorder (ADD) without hyperactivity - Ok to refill concerta x 2 additional months when requested.   2. Diaphoresis   3. Encounter for long-term current use of high risk medication   4. Hyperhidrosis   5. Mild episode of recurrent major depressive disorder (HCC) - increase effexor from 37.5 to 75  6. Anxiety state   7. Obsessive-compulsive disorder, unspecified type   Requests recommendation for therapist[place]psychologist in [place] which [place] be more convenient to his work and home and so more apt to utilize - never went to prior referrals but now admits that this is something he could likely benefit from - highly encouraged pt to try - even consider on-line or messaging/skyping options if that would allow him to actually utilize therapist/CBT skills.    Orders Placed This Encounter  Procedures  . TSH  . CBC with Differential/Platelet  . Comprehensive metabolic panel    Meds ordered this encounter  Medications  . venlafaxine XR (EFFEXOR-XR) 75 MG 24 hr capsule    Sig: Take 1 capsule (75 mg total) by mouth daily with breakfast.     Dispense:  90 capsule    Refill:  1  . methylphenidate (CONCERTA) 27 MG PO CR tablet    Sig: Take 1 tablet (27 mg total) by mouth every morning.    Dispense:  30 tablet    Refill:  0  . methylphenidate (CONCERTA) 36 MG PO CR tablet    Sig: Take 1 tablet (36 mg total) by mouth daily.    Dispense:  30 tablet  Refill:  0  . glycopyrrolate (ROBINUL) 1 MG tablet    Sig: Take 1 tablet (1 mg total) by mouth 2 (two) times daily.    Dispense:  60 tablet    Refill:  2  . propranolol (INDERAL) 40 MG tablet    Sig: Take 1 tablet (40 mg total) by mouth 3 (three) times daily as needed (anxiety/panic).    Dispense:  90 tablet    Refill:  2  . glycopyrrolate (ROBINUL) 1 MG tablet    Sig: Take 1 tablet (1 mg total) by mouth 2 (two) times daily.    Dispense:  60 tablet    Refill:  2  . amphetamine-dextroamphetamine (ADDERALL) 10 MG tablet    Sig: Take 1 tablet (10 mg total) by mouth 2 (two) times daily with a meal. As needed to augment concerta    Dispense:  60 tablet    Refill:  0  . amphetamine-dextroamphetamine (ADDERALL) 10 MG tablet    Sig: Take 1 tablet (10 mg total) by mouth 2 (two) times daily with a meal. As needed to augment concerta    Dispense:  60 tablet    Refill:  0  . amphetamine-dextroamphetamine (ADDERALL) 10 MG tablet    Sig: Take 1 tablet (10 mg total) by mouth 2 (two) times daily with a meal. As needed to augment concerta    Dispense:  60 tablet    Refill:  0   I personally performed the services described in this documentation, which was scribed in my presence. The recorded information has been reviewed and considered, and addended by me as needed.   Over 40 min spent in face-to-face evaluation of and consultation with patient and coordination of care.  Over 50% of this time was spent counseling this patient regarding mood sxs, hyperhidrosis poss treatments, how stimulant therapy might be exacerbating these other conditions and ways we might be able to increase its  effectivness.  Norberto Sorenson, M.D.  Primary Care at [facility] [address] [place], [place] [address] [phone] phone [phone] fax  02/26/18 4:57 PM

## 2017-11-24 NOTE — Patient Instructions (Addendum)
Check out the talkspace and let me know what you think - I will get some good recs for people in [place]   IF you received an x-ray today, you will receive an invoice from [facility]. Please contact [facility] at [phone] with questions or concerns regarding your invoice.   IF you received labwork today, you will receive an invoice from [facility]. Please contact [facility] at [phone] with questions or concerns regarding your invoice.   Our billing staff will not be able to assist you with questions regarding bills from these companies.  You will be contacted with the lab results as soon as they are available. The fastest way to get your results is to activate your My Chart account. Instructions are located on the last page of this paperwork. If you have not heard from [place] regarding the results in 2 weeks, please contact this office.    Hyperhidrosis It is normal to sweat when you are hot, being physically active, or feeling anxious. Sweating is a necessary function for your body. However, hyperhidrosis is when you sweat too much (excessively). Although hyperhidrosis is not dangerous, it can make you feel embarrassed. There are two kinds of hyperhidrosis:  Primary hyperhidrosis. The sweating usually localizes in one part of your body, such as your underarms, or in a few areas, such as your feet, face, armpits, and hands. This is the more common kind of hyperhidrosis.  Secondary hyperhidrosis. This type more likely affects your entire body.  What are the causes? The cause of your hyperhidrosis depends on the kind you have.  Primary hyperhidrosis may be caused by having sweat glands that are more active than normal.  Secondary hyperhidrosis is caused by an underlying condition. Possible conditions include: ? Diabetes. ? Gout. ? Certain medicines. ? Anxiety. ? Stroke. ? Obesity. ? Menopause. ? Overactive thyroid  (hyperthyroidism). ? Tumors. ? Frostbite. ? Certain types of cancers. ? Alcoholism. ? Injury to your nervous system. ? Stroke. ? Parkinson disease.  What increases the risk? You may be at an increased risk for primary hyperhidrosis if you have a family history of it. What are the signs or symptoms? General symptoms of hyperhidrosis may include:  Feeling like you are sweating constantly, even while you are resting.  Having skin that peels or gets paler or softer in the areas where you sweat the most.  Being able to see sweat on your skin.  Symptoms of primary hyperhidrosis may include:  Sweating in specific areas, such as your armpits, palms, feet, and face.  Sweating in the same location on both sides of your body.  Sweating only during the day.  Symptoms of secondary hyperhidrosis may include:  Sweating all over your body.  Sweating even while you sleep.  How is this diagnosed? Hyperhidrosis may be diagnosed by:  Medical history and physical exam.  Testing, such as: ? Sweat test. ? Paper test.  How is this treated? Your treatment will depend on the kind of hyperhidrosis you have and the parts of your body that are affected. If your hyperhidrosis is caused by an underlying condition, your treatment will address the cause. Treatment may include:  Strong antiperspirants. Your health care provider may give you a prescription.  Medicines taken by mouth.  Medicines injected by your health care provider. These may include small amounts of botulinum toxin.  Iontophoresis. This is a procedure that temporarily turns off the sweat glands in your hands and feet.  Surgery to remove your sweat glands.  Sympathectomy.  This is a procedure that cuts or destroys your nerves so that they do not send a signal to sweat.  Follow these instructions at home:  Take medicines only as directed by your health care provider.  Use antiperspirants as directed by your health care  provider.  Limit or avoid foods or beverages that seem to increase your chances of sweating, such as: ? Spicy food. ? Caffeine. ? Alcohol. ? Foods that contain MSG.  If your feet sweat: ? Wear sandals, when possible. ? Do not wear cotton socks. Wear socks that remove or wick moisture from your feet. ? Wear leather shoes. ? Avoid wearing the same pair of shoes two days in a row.  Consider joining a hyperhidrosis support group. Contact a health care provider if:  You have new symptoms.  Your symptoms get worse. This information is not intended to replace advice given to you by your health care provider. Make sure you discuss any questions you have with your health care provider. Document Released: 07/14/2005 Document Revised: 10/21/2015 Document Reviewed: 12/23/2013 Elsevier Interactive Patient Education  [employer]2018 Elsevier Inc.

## 2017-11-25 LAB — COMPREHENSIVE METABOLIC PANEL
ALBUMIN: 4.7 g/dL (ref 3.5–5.5)
ALT: 66 IU/L — ABNORMAL HIGH (ref 0–44)
AST: 32 IU/L (ref 0–40)
Albumin/Globulin Ratio: 1.9 (ref 1.2–2.2)
Alkaline Phosphatase: 76 IU/L (ref 39–117)
BUN/Creatinine Ratio: 13 (ref 9–20)
BUN: 11 mg/dL (ref 6–20)
Bilirubin Total: 0.6 mg/dL (ref 0.0–1.2)
CALCIUM: 9.5 mg/dL (ref 8.7–10.2)
CO2: 24 mmol/L (ref 20–29)
CREATININE: 0.88 mg/dL (ref 0.76–1.27)
Chloride: 105 mmol/L (ref 96–106)
GFR, EST AFRICAN AMERICAN: 131 mL/min/{1.73_m2} (ref >59)
GFR, EST NON AFRICAN AMERICAN: 114 mL/min/{1.73_m2} (ref >59)
GLOBULIN, TOTAL: 2.5 g/dL (ref 1.5–4.5)
Glucose: 102 mg/dL — ABNORMAL HIGH (ref 65–99)
Potassium: 4.9 mmol/L (ref 3.5–5.2)
SODIUM: 141 mmol/L (ref 134–144)
TOTAL PROTEIN: 7.2 g/dL (ref 6.0–8.5)

## 2017-11-25 LAB — CBC WITH DIFFERENTIAL/PLATELET
BASOS ABS: 0 10*3/uL (ref 0.0–0.2)
Basos: 1 %
EOS (ABSOLUTE): 0.1 10*3/uL (ref 0.0–0.4)
Eos: 2 %
HEMATOCRIT: 48.1 % (ref 37.5–51.0)
HEMOGLOBIN: 16.4 g/dL (ref 13.0–17.7)
IMMATURE GRANS (ABS): 0 10*3/uL (ref 0.0–0.1)
Immature Granulocytes: 0 %
LYMPHS: 35 %
Lymphocytes Absolute: 2 10*3/uL (ref 0.7–3.1)
MCH: 31.7 pg (ref 26.6–33.0)
MCHC: 34.1 g/dL (ref 31.5–35.7)
MCV: 93 fL (ref 79–97)
MONOCYTES: 11 %
Monocytes Absolute: 0.7 10*3/uL (ref 0.1–0.9)
Neutrophils Absolute: 3 10*3/uL (ref 1.4–7.0)
Neutrophils: 51 %
Platelets: 241 10*3/uL (ref 150–450)
RBC: 5.18 x10E6/uL (ref 4.14–5.80)
RDW: 13.7 % (ref 12.3–15.4)
WBC: 5.9 10*3/uL (ref 3.4–10.8)

## 2017-11-25 LAB — TSH: TSH: 2.46 u[IU]/mL (ref 0.450–4.500)

## 2018-01-21 ENCOUNTER — Encounter: Payer: Self-pay | Admitting: Family Medicine

## 2018-01-21 ENCOUNTER — Other Ambulatory Visit: Payer: Self-pay | Admitting: Family Medicine

## 2018-01-30 ENCOUNTER — Telehealth: Payer: Self-pay | Admitting: Family Medicine

## 2018-01-30 NOTE — Telephone Encounter (Signed)
Copied from CRM [id number]. Topic: Quick Communication - Rx Refill/Question >> Jan 30, 2018  8:09 AM Zada Girt, Lumin L wrote: Medication: methylphenidate (CONCERTA) 36 MG PO CR tablet, amphetamine-dextroamphetamine (ADDERALL) 10 MG tablet   Has the patient contacted their pharmacy? Yes.   (Agent: If no, request that the patient contact the pharmacy for the refill.) (Agent: If yes, when and what did the pharmacy advise?)  Preferred Pharmacy (with phone number or street name): [id number] [place] [place] - [address] [facility] [address] [address] [place] [address] Phone: [phone] Fax: [phone]  Agent: Please be advised that RX refills may take up to 3 business days. We ask that you follow-up with your pharmacy.

## 2018-02-01 NOTE — Telephone Encounter (Signed)
Please advise. Dgaddy, CMA 

## 2018-02-05 NOTE — Telephone Encounter (Signed)
Patient checking status, out of meds, would like to pick up refill today, CB # [phone]

## 2018-02-06 NOTE — Telephone Encounter (Signed)
Patient called to say that he has been out of his medication for about a week now and is in need. amphetamine-dextroamphetamine (ADDERALL) 10 MG tablet, methylphenidate (CONCERTA) 27 MG PO CR tablet    Requesting another MD to sign off on orders as Dr Clelia Croft is out of the office. Please follow up with patient as soon as possible. Ph [phone]

## 2018-02-07 MED ORDER — AMPHETAMINE-DEXTROAMPHETAMINE 10 MG PO TABS: 10.0000 mg | ORAL_TABLET | Freq: Two times a day (BID) | ORAL | 0 refills | Status: DC

## 2018-02-07 MED ORDER — METHYLPHENIDATE HCL ER (OSM) 36 MG PO TBCR: 36.0000 mg | EXTENDED_RELEASE_TABLET | Freq: Every day | ORAL | 0 refills | Status: DC

## 2018-02-07 NOTE — Telephone Encounter (Signed)
pmp reviewed meds refilled 

## 2018-02-07 NOTE — Telephone Encounter (Signed)
Patient was transferred to schedule an appointment 

## 2018-02-08 ENCOUNTER — Other Ambulatory Visit: Payer: Self-pay | Admitting: Family Medicine

## 2018-02-08 MED ORDER — METHYLPHENIDATE HCL ER (OSM) 36 MG PO TBCR: 36.0000 mg | EXTENDED_RELEASE_TABLET | Freq: Every day | ORAL | 0 refills | Status: DC

## 2018-02-08 NOTE — Progress Notes (Signed)
Script printed  Discarded printed script and sent electronically

## 2018-02-26 DIAGNOSIS — R61 Generalized hyperhidrosis: Secondary | ICD-10-CM | POA: Insufficient documentation

## 2018-02-27 ENCOUNTER — Other Ambulatory Visit: Payer: Self-pay | Admitting: Family Medicine

## 2018-02-27 MED ORDER — VENLAFAXINE HCL ER 37.5 MG PO CP24: 37.5000 mg | ORAL_CAPSULE | Freq: Every day | ORAL | 2 refills | Status: DC

## 2018-02-27 MED ORDER — METHYLPHENIDATE HCL ER (OSM) 36 MG PO TBCR: 36.0000 mg | EXTENDED_RELEASE_TABLET | Freq: Every day | ORAL | 0 refills | Status: DC

## 2018-03-02 ENCOUNTER — Ambulatory Visit: Payer: BLUE CROSS/BLUE SHIELD | Admitting: Physician Assistant

## 2018-04-01 ENCOUNTER — Encounter: Payer: Self-pay | Admitting: Family Medicine

## 2018-04-13 ENCOUNTER — Other Ambulatory Visit: Payer: Self-pay | Admitting: Family Medicine

## 2018-04-13 NOTE — Telephone Encounter (Signed)
Pt calling for refills Glycopyrrolate, adderhall, and concerta to be sent to usual pharamcy. Patient was unable to get in with Dr. Clelia CroftShaw in a reasonable time frame and was requesting he have them refilled until his visit on 05/14/18

## 2018-04-14 NOTE — Telephone Encounter (Signed)
Message sent to Dr. Clelia CroftShaw Re: pt has appt 12/17 Requests refills on adderall, concerta & glycopyrrolate

## 2018-04-16 NOTE — Telephone Encounter (Signed)
Pt inquiring about status of refills. States he has been out for 4 days. Please advise

## 2018-04-17 ENCOUNTER — Other Ambulatory Visit: Payer: Self-pay | Admitting: Family Medicine

## 2018-04-17 ENCOUNTER — Encounter: Payer: Self-pay | Admitting: Family Medicine

## 2018-04-17 MED ORDER — METHYLPHENIDATE HCL ER (OSM) 36 MG PO TBCR: 36.0000 mg | EXTENDED_RELEASE_TABLET | Freq: Every day | ORAL | 0 refills | Status: AC

## 2018-04-17 MED ORDER — AMPHETAMINE-DEXTROAMPHETAMINE 10 MG PO TABS: 10.0000 mg | ORAL_TABLET | Freq: Two times a day (BID) | ORAL | 0 refills | Status: DC

## 2018-04-17 MED ORDER — AMPHETAMINE-DEXTROAMPHETAMINE 10 MG PO TABS: 10.0000 mg | ORAL_TABLET | Freq: Two times a day (BID) | ORAL | 0 refills | Status: AC

## 2018-04-17 MED ORDER — GLYCOPYRROLATE 1 MG PO TABS: 1.0000 mg | ORAL_TABLET | Freq: Two times a day (BID) | ORAL | 2 refills | Status: AC

## 2018-04-17 NOTE — Telephone Encounter (Signed)
I don't know - I'm assuming we don't.  Please let pt know.

## 2018-04-17 NOTE — Telephone Encounter (Signed)
Refills sent in x 2 mos - ok to resched appt for mid January as I sure will be busy over the holidays.

## 2018-05-14 ENCOUNTER — Encounter

## 2018-05-14 ENCOUNTER — Ambulatory Visit: Payer: BLUE CROSS/BLUE SHIELD | Admitting: Family Medicine

## 2018-05-14 IMAGING — DX DG ABDOMEN ACUTE W/ 1V CHEST
4 series · 4 of 4 positions shown · non-contrast
Comparison: None

CLINICAL DATA: Five days of waxing and waning LEFT side abdominal
pain, lower greater than upper, temporarily improved with bowel
movements

EXAM:
DG ABDOMEN ACUTE W/ 1V CHEST

[chest pa]
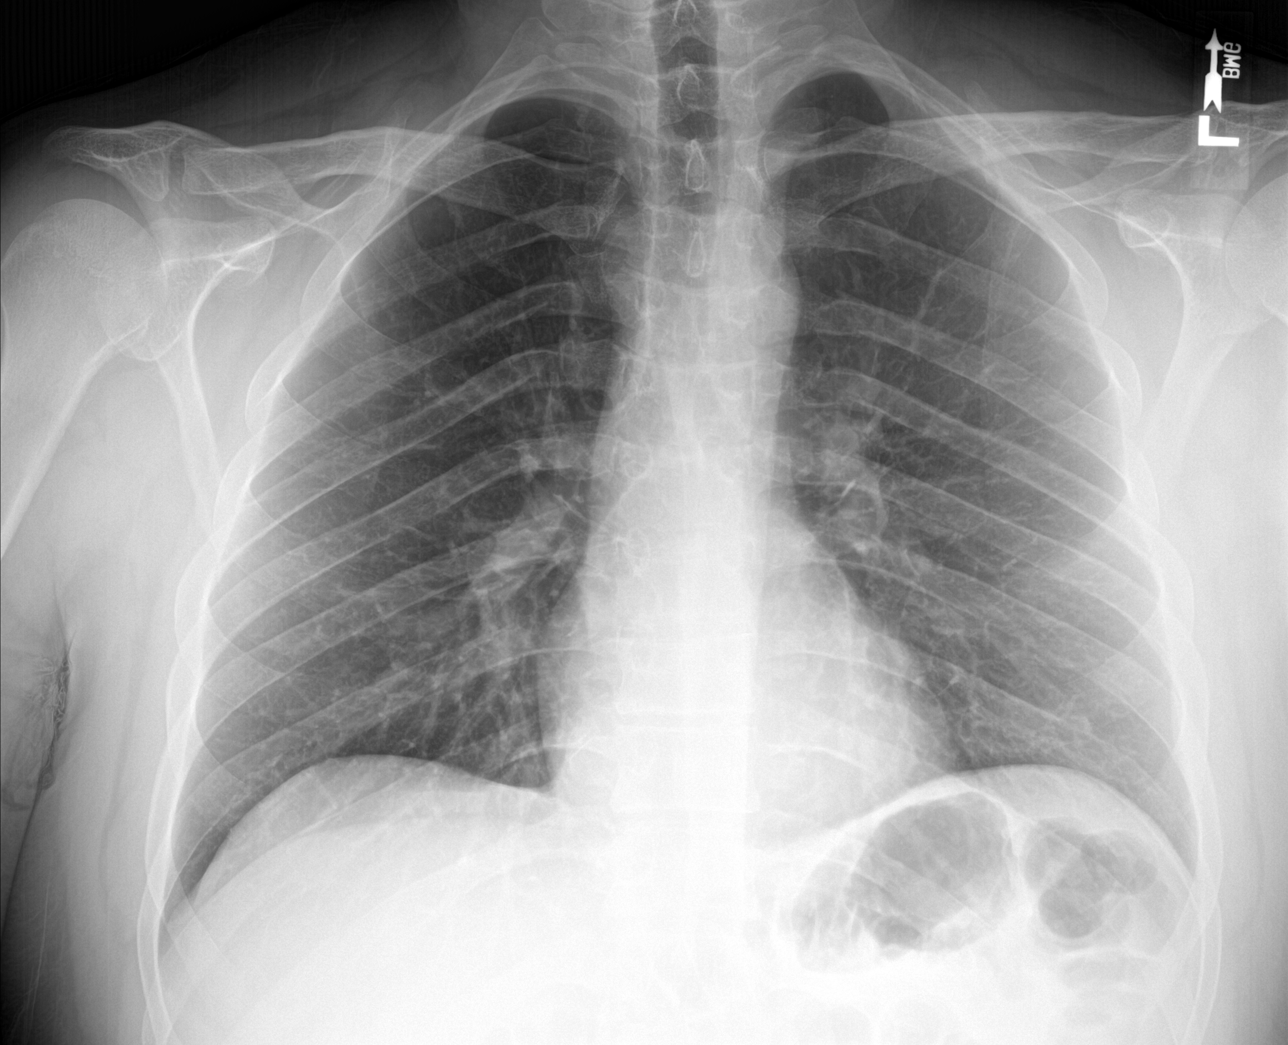

[abdomen erect]
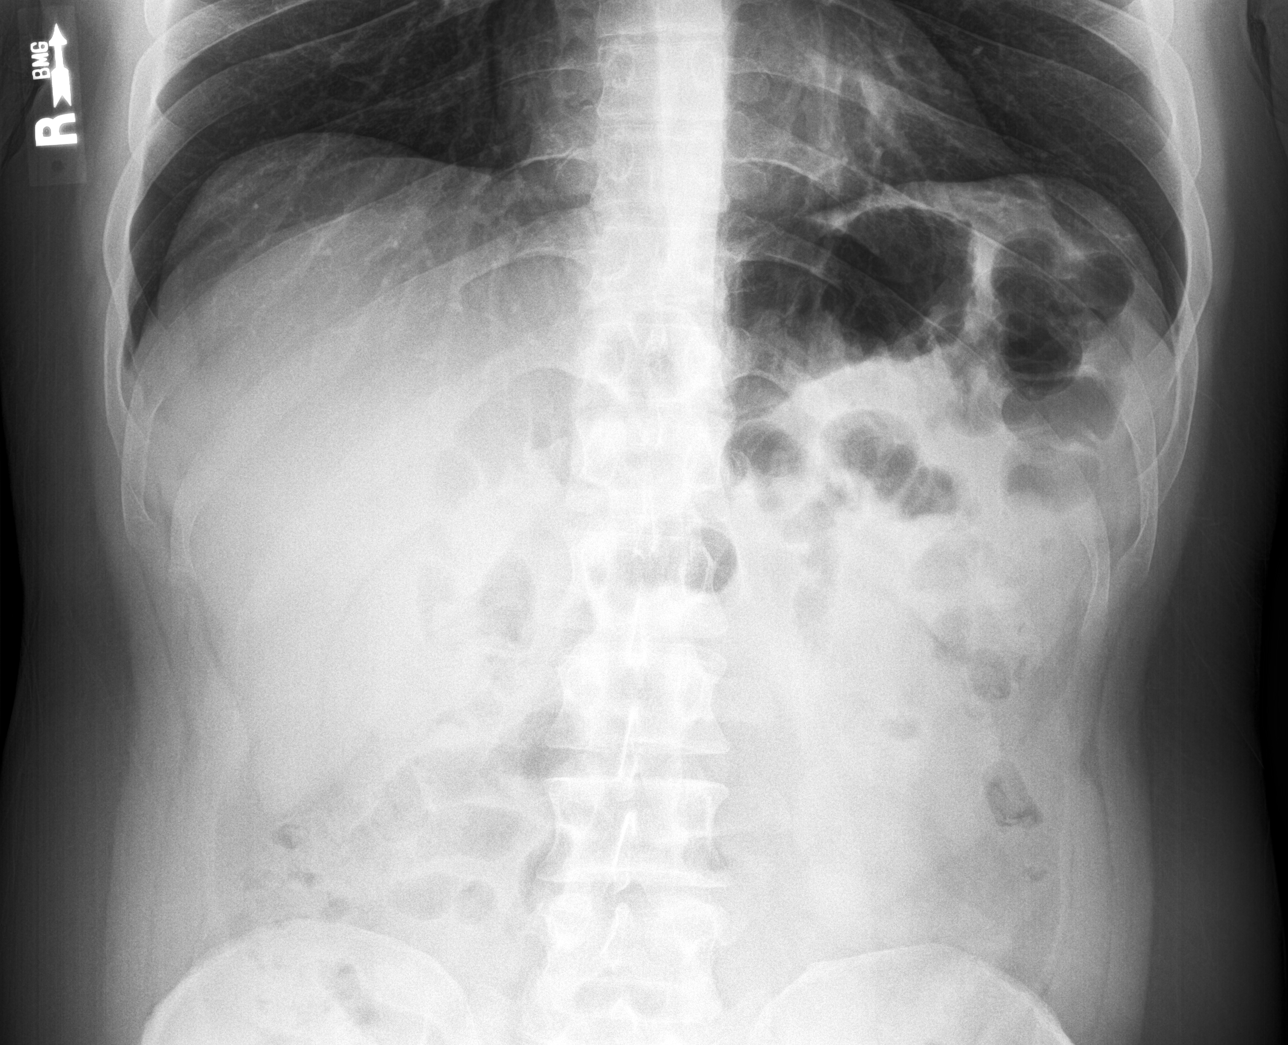

[abdomen supine (1 of 2)]
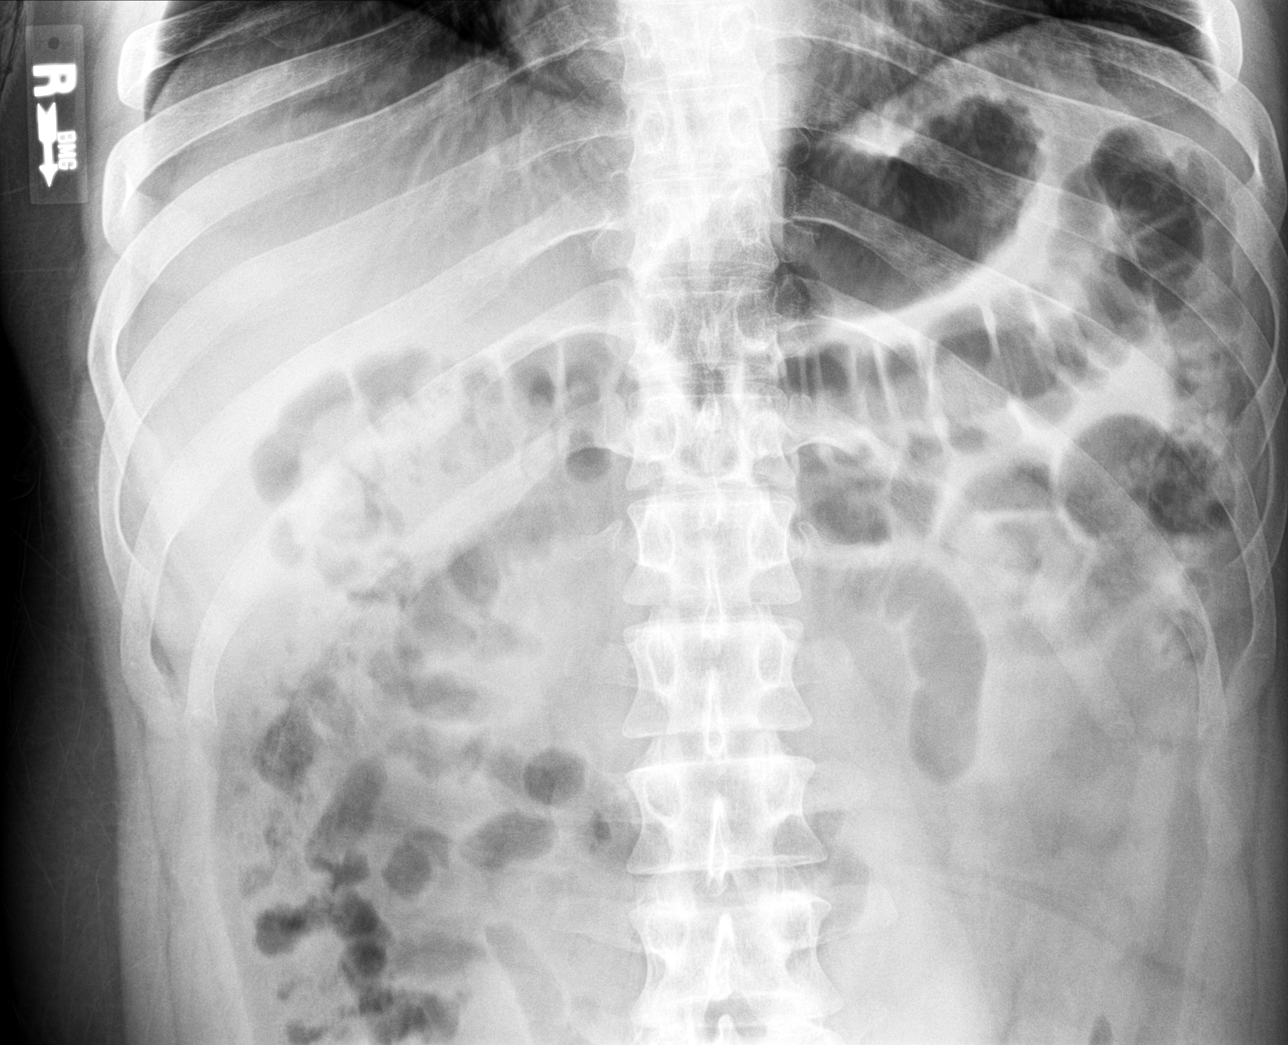

[abdomen supine (2 of 2)]
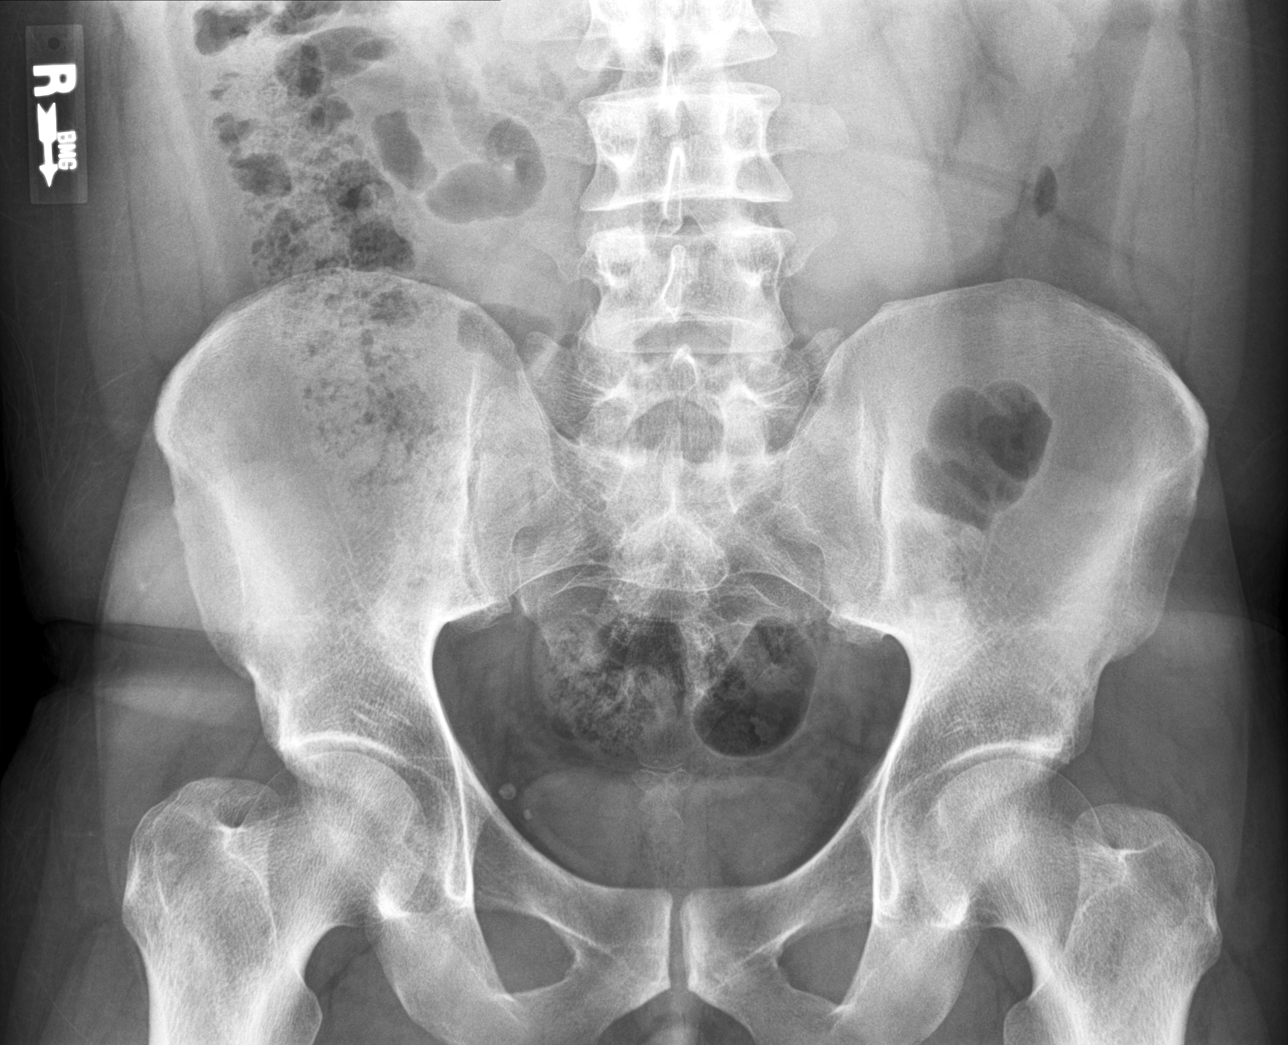

[4 of 4 positions shown; findings below may reference images not displayed]

FINDINGS: Normal heart size, mediastinal contours, and pulmonary vascularity.

Lungs clear.

No pleural effusion or pneumothorax.

Normal bowel gas pattern.

No bowel dilatation, bowel wall thickening or free air.

Osseous structures unremarkable.

No urinary tract calcification.

Small RIGHT pelvic phleboliths noted.
IMPRESSION: Normal exam.

## 2018-06-03 ENCOUNTER — Telehealth: Payer: Self-pay | Admitting: Family Medicine

## 2018-06-03 NOTE — Telephone Encounter (Signed)
CALLED PATIENT AND LVM FOR HIM TO CALL AND RESCHEDULE 01/06

## 2018-06-04 ENCOUNTER — Ambulatory Visit: Payer: BLUE CROSS/BLUE SHIELD | Admitting: Family Medicine

## 2018-06-17 ENCOUNTER — Other Ambulatory Visit: Payer: Self-pay | Admitting: Family Medicine

## 2018-06-17 NOTE — Telephone Encounter (Signed)
Copied from CRM [id number]. Topic: Quick Communication - Rx Refill/Question >> Jun 17, 2018 11:35 AM Jilda Roche wrote: Medication: methylphenidate (CONCERTA) 36 MG PO CR tablet, amphetamine-dextroamphetamine (ADDERALL) 10 MG tablet  Has the patient contacted their pharmacy? No. (Agent: If no, request that the patient contact the pharmacy for the refill.) (Agent: If yes, when and what did the pharmacy advise?)  Preferred Pharmacy (with phone number or street name): [id number] [place], [place] - [address] [phone] (Phone) [phone] (Fax    Agent: Please be advised that RX refills may take up to 3 business days. We ask that you follow-up with your pharmacy.

## 2018-06-19 NOTE — Telephone Encounter (Signed)
Patient is requesting a refill of the following medications: Requested Prescriptions   Pending Prescriptions Disp Refills  . amphetamine-dextroamphetamine (ADDERALL) 10 MG tablet 60 tablet 0    Sig: Take 1 tablet (10 mg total) by mouth 2 (two) times daily with a meal for 30 days. As needed to augment concerta  . methylphenidate (CONCERTA) 36 MG PO CR tablet 30 tablet 0    Sig: Take 1 tablet (36 mg total) by mouth daily.    Date of patient request: 06/17/2018 Last office visit: 11/24/17 Date of last refill:  Last refill amount:60 Follow up time period per chart: 03/26/18

## 2018-06-21 MED ORDER — METHYLPHENIDATE HCL ER (OSM) 36 MG PO TBCR: 36.0000 mg | EXTENDED_RELEASE_TABLET | Freq: Every day | ORAL | 0 refills | Status: AC

## 2018-06-21 MED ORDER — AMPHETAMINE-DEXTROAMPHETAMINE 10 MG PO TABS: 10.0000 mg | ORAL_TABLET | Freq: Two times a day (BID) | ORAL | 0 refills | Status: AC

## 2018-07-06 ENCOUNTER — Other Ambulatory Visit: Payer: Self-pay | Admitting: Family Medicine

## 2018-07-06 ENCOUNTER — Telehealth: Payer: Self-pay | Admitting: Family Medicine

## 2018-07-06 NOTE — Telephone Encounter (Signed)
07/06/2018 - PATIENT'S WIFE (NICOLE) STATES HER HUSBAND NEEDS A REFILL ON HIS VENLAFAXINE 37.5 MG. SHE SAID HE ALSO HAS THE FLU FOR 2 DAYS AND HE WILL GO INTO WITHDRAWAL IF HE DOES NOT GET AN IMMEDIATE REFILL ON THIS MEDICATION. BEST PHONE [phone] (NICOLE Petrelli) [facility] [place] [place]  MBC

## 2018-07-08 NOTE — Telephone Encounter (Signed)
Please advise 

## 2018-07-08 NOTE — Telephone Encounter (Signed)
Requested Prescriptions  Refused Prescriptions Disp Refills  . venlafaxine XR (EFFEXOR-XR) 75 MG 24 hr capsule [Pharmacy Med Name: VENLAFAXINE HCL ER 75 MG CAP] 90 capsule 1    Sig: TAKE 1 CAPSULE (75 MG TOTAL) BY MOUTH DAILY WITH BREAKFAST.     Psychiatry: Antidepressants - SNRI - desvenlafaxine & venlafaxine Failed - 07/08/2018  9:24 AM      Failed - LDL in normal range and within 360 days    No results found for: LDLCALC, LDLC, HIRISKLDL       Failed - Total Cholesterol in normal range and within 360 days    No results found for: CHOL, POCCHOL       Failed - Triglycerides in normal range and within 360 days    No results found for: TRIG       Failed - Valid encounter within last 6 months    Recent Outpatient Visits          7 months ago Attention deficit disorder (ADD) without hyperactivity   [facility], Levell July, MD   11 months ago Attention deficit disorder (ADD) without hyperactivity   [facility], Levell July, MD   1 year ago Abdominal pain, left lower quadrant   [facility], Levell July, MD   1 year ago Attention deficit disorder (ADD) without hyperactivity   [facility], Levell July, MD   2 years ago Abdominal bloating   [facility], Levell July, MD             Passed - Last BP in normal range    BP Readings from Last 1 Encounters:  11/24/17 135/82

## 2018-07-08 NOTE — Telephone Encounter (Signed)
See TE. Review for refill.  Dr. Clelia Croft

## 2018-07-09 ENCOUNTER — Other Ambulatory Visit: Payer: Self-pay | Admitting: Family Medicine

## 2018-07-09 NOTE — Telephone Encounter (Signed)
Pt called and states that he is at home with the flu, and is unable to come out; he also says that he will withdraw if he runs out of medication; explained to pt last office visit 2/29/19 and that another is required for refills; pt offered and accepted appointment with Dr Alvy Bimler, [address], 08/09/2018 at 1340; he verbalized understanding; he also verified [facility] pharmacy [facility] [place], [place]; will refill medication to cover pt until upcoming appointment.  Requested Prescriptions  Pending Prescriptions Disp Refills  . venlafaxine XR (EFFEXOR-XR) 37.5 MG 24 hr capsule [Pharmacy Med Name: VENLAFAXINE HCL ER 37.5 MG CAP] 30 capsule 2    Sig: TAKE 1 CAPSULE (37.5 MG TOTAL) BY MOUTH DAILY WITH BREAKFAST.     Psychiatry: Antidepressants - SNRI - desvenlafaxine & venlafaxine Failed - 07/09/2018 12:07 PM      Failed - LDL in normal range and within 360 days    No results found for: LDLCALC, LDLC, HIRISKLDL       Failed - Total Cholesterol in normal range and within 360 days    No results found for: CHOL, POCCHOL       Failed - Triglycerides in normal range and within 360 days    No results found for: TRIG       Failed - Valid encounter within last 6 months    Recent Outpatient Visits          7 months ago Attention deficit disorder (ADD) without hyperactivity   [facility], Levell July, MD   11 months ago Attention deficit disorder (ADD) without hyperactivity   [facility], Levell July, MD   1 year ago Abdominal pain, left lower quadrant   [facility], Levell July, MD   1 year ago Attention deficit disorder (ADD) without hyperactivity   [facility], Levell July, MD   2 years ago Abdominal bloating   [facility], Levell July, MD      Future Appointments            In 1 month Sagardia, Eilleen Kempf, MD Primary Care at [place], [facility]           Passed - Last BP in normal range    BP Readings from Last 1 Encounters:  11/24/17  135/82

## 2018-07-09 NOTE — Telephone Encounter (Signed)
Refill request for venlafaxine XR  37.5 mg; last office visit 11/24/17; no upcoming visits noted; last refill 06/02/2018 #30; attempted to contact pt; left message on voicemail [phone]

## 2018-07-11 NOTE — Telephone Encounter (Signed)
Rx was sent to pharmacy by provider on 07/09/2018

## 2018-07-26 ENCOUNTER — Telehealth: Payer: Self-pay | Admitting: Family Medicine

## 2018-07-26 NOTE — Telephone Encounter (Signed)
LVM letting patient know appnt was cancelled and if they would like to continue with [place] please call to schedule with another provider

## 2018-07-29 ENCOUNTER — Ambulatory Visit: Payer: Self-pay | Admitting: Family Medicine

## 2018-07-30 ENCOUNTER — Ambulatory Visit: Payer: Self-pay | Admitting: Family Medicine

## 2018-08-07 ENCOUNTER — Other Ambulatory Visit: Payer: Self-pay | Admitting: Family Medicine

## 2018-08-09 ENCOUNTER — Ambulatory Visit: Payer: Self-pay | Admitting: Emergency Medicine

## 2018-09-17 ENCOUNTER — Other Ambulatory Visit: Payer: Self-pay | Admitting: Family Medicine
